# Patient Record
Sex: Female | Born: 1970 | Race: Black or African American | Hispanic: No | Marital: Single | Smoking: Never smoker
Health system: Southern US, Community
[De-identification: ages and names within clinical notes are randomized; demographics above are authoritative.]

## PROBLEM LIST (undated history)

## (undated) DIAGNOSIS — T7840XA Allergy, unspecified, initial encounter: Secondary | ICD-10-CM

## (undated) HISTORY — DX: Allergy, unspecified, initial encounter: T78.40XA

## (undated) HISTORY — PX: UTERINE FIBROID EMBOLIZATION: SHX825

---

## 2016-04-18 ENCOUNTER — Encounter (HOSPITAL_COMMUNITY): Payer: Self-pay

## 2016-04-18 ENCOUNTER — Emergency Department (HOSPITAL_COMMUNITY): Payer: No Typology Code available for payment source

## 2016-04-18 ENCOUNTER — Emergency Department (HOSPITAL_COMMUNITY)
Admission: EM | Admit: 2016-04-18 | Discharge: 2016-04-18 | Disposition: A | Discharge status: Home / Self Care | Payer: No Typology Code available for payment source | Attending: Emergency Medicine | Admitting: Emergency Medicine

## 2016-04-18 DIAGNOSIS — Y939 Activity, unspecified: Secondary | ICD-10-CM | POA: Insufficient documentation

## 2016-04-18 DIAGNOSIS — Y92410 Unspecified street and highway as the place of occurrence of the external cause: Secondary | ICD-10-CM | POA: Diagnosis not present

## 2016-04-18 DIAGNOSIS — Y999 Unspecified external cause status: Secondary | ICD-10-CM | POA: Insufficient documentation

## 2016-04-18 DIAGNOSIS — M542 Cervicalgia: Secondary | ICD-10-CM

## 2016-04-18 DIAGNOSIS — M545 Low back pain: Secondary | ICD-10-CM

## 2016-04-18 DIAGNOSIS — S3992XA Unspecified injury of lower back, initial encounter: Secondary | ICD-10-CM | POA: Insufficient documentation

## 2016-04-18 DIAGNOSIS — S199XXA Unspecified injury of neck, initial encounter: Secondary | ICD-10-CM | POA: Diagnosis not present

## 2016-04-18 MED ORDER — ACETAMINOPHEN 325 MG PO TABS
650.0000 mg | ORAL_TABLET | Freq: Once | ORAL | Status: AC
  Administered 2016-04-18: 650 mg via ORAL
  Filled 2016-04-18: qty 2
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
## 2016-04-18 NOTE — ED Notes (Signed)
PT placed in c-collar.
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
## 2016-06-29 ENCOUNTER — Other Ambulatory Visit: Payer: Self-pay

## 2016-06-29 DIAGNOSIS — D241 Benign neoplasm of right breast: Secondary | ICD-10-CM

## 2016-09-05 ENCOUNTER — Other Ambulatory Visit: Payer: Self-pay

## 2016-09-05 DIAGNOSIS — D241 Benign neoplasm of right breast: Secondary | ICD-10-CM

## 2016-09-13 ENCOUNTER — Other Ambulatory Visit: Payer: Self-pay

## 2016-09-22 ENCOUNTER — Ambulatory Visit
Admission: RE | Admit: 2016-09-22 | Discharge: 2016-09-22 | Disposition: A | Discharge status: Home / Self Care | Payer: Self-pay | Source: Ambulatory Visit

## 2016-09-22 DIAGNOSIS — D241 Benign neoplasm of right breast: Secondary | ICD-10-CM

## 2017-05-02 ENCOUNTER — Other Ambulatory Visit: Payer: Self-pay

## 2017-05-02 ENCOUNTER — Ambulatory Visit
Admission: RE | Admit: 2017-05-02 | Discharge: 2017-05-02 | Disposition: A | Discharge status: Home / Self Care | Payer: Self-pay | Source: Ambulatory Visit | Attending: Family Medicine | Admitting: Family Medicine

## 2017-05-02 ENCOUNTER — Other Ambulatory Visit: Payer: Self-pay | Admitting: Family Medicine

## 2017-05-02 DIAGNOSIS — M79671 Pain in right foot: Secondary | ICD-10-CM

## 2017-05-02 DIAGNOSIS — D241 Benign neoplasm of right breast: Secondary | ICD-10-CM

## 2017-05-08 ENCOUNTER — Other Ambulatory Visit: Payer: BC Managed Care – PPO

## 2017-05-17 ENCOUNTER — Other Ambulatory Visit: Payer: Self-pay

## 2017-05-17 ENCOUNTER — Ambulatory Visit
Admission: RE | Admit: 2017-05-17 | Discharge: 2017-05-17 | Disposition: A | Discharge status: Home / Self Care | Payer: BC Managed Care – PPO | Source: Ambulatory Visit

## 2017-05-17 DIAGNOSIS — D241 Benign neoplasm of right breast: Secondary | ICD-10-CM

## 2017-06-16 ENCOUNTER — Other Ambulatory Visit: Payer: Self-pay | Admitting: Obstetrics and Gynecology

## 2017-06-16 DIAGNOSIS — R102 Pelvic and perineal pain: Secondary | ICD-10-CM

## 2017-06-16 DIAGNOSIS — N920 Excessive and frequent menstruation with regular cycle: Secondary | ICD-10-CM

## 2017-06-25 ENCOUNTER — Other Ambulatory Visit: Payer: BC Managed Care – PPO

## 2017-07-05 ENCOUNTER — Ambulatory Visit: Payer: Self-pay | Admitting: Family Medicine

## 2017-07-24 ENCOUNTER — Other Ambulatory Visit: Payer: Self-pay | Admitting: Obstetrics and Gynecology

## 2017-07-24 DIAGNOSIS — D259 Leiomyoma of uterus, unspecified: Secondary | ICD-10-CM

## 2017-07-24 DIAGNOSIS — N920 Excessive and frequent menstruation with regular cycle: Secondary | ICD-10-CM

## 2017-07-24 DIAGNOSIS — R102 Pelvic and perineal pain: Secondary | ICD-10-CM

## 2017-07-26 ENCOUNTER — Ambulatory Visit
Admission: RE | Admit: 2017-07-26 | Discharge: 2017-07-26 | Disposition: A | Discharge status: Home / Self Care | Payer: BC Managed Care – PPO | Source: Ambulatory Visit | Attending: Obstetrics and Gynecology | Admitting: Obstetrics and Gynecology

## 2017-07-26 DIAGNOSIS — R102 Pelvic and perineal pain: Secondary | ICD-10-CM

## 2017-07-26 DIAGNOSIS — N920 Excessive and frequent menstruation with regular cycle: Secondary | ICD-10-CM

## 2017-07-26 DIAGNOSIS — D259 Leiomyoma of uterus, unspecified: Secondary | ICD-10-CM

## 2017-07-26 MED ORDER — GADOBENATE DIMEGLUMINE 529 MG/ML IV SOLN
20.0000 mL | Freq: Once | INTRAVENOUS | Status: AC | PRN
  Administered 2017-07-26: 20 mL via INTRAVENOUS

## 2017-10-27 ENCOUNTER — Other Ambulatory Visit: Payer: Self-pay

## 2017-10-27 ENCOUNTER — Ambulatory Visit
Admission: RE | Admit: 2017-10-27 | Discharge: 2017-10-27 | Disposition: A | Discharge status: Home / Self Care | Payer: BC Managed Care – PPO | Source: Ambulatory Visit

## 2017-10-27 DIAGNOSIS — N6310 Unspecified lump in the right breast, unspecified quadrant: Secondary | ICD-10-CM

## 2017-10-27 DIAGNOSIS — D241 Benign neoplasm of right breast: Secondary | ICD-10-CM

## 2018-05-16 ENCOUNTER — Ambulatory Visit
Admission: RE | Admit: 2018-05-16 | Discharge: 2018-05-16 | Disposition: A | Discharge status: Home / Self Care | Payer: BC Managed Care – PPO | Source: Ambulatory Visit

## 2018-05-16 DIAGNOSIS — N6310 Unspecified lump in the right breast, unspecified quadrant: Secondary | ICD-10-CM

## 2018-06-28 ENCOUNTER — Other Ambulatory Visit: Payer: Self-pay | Admitting: Obstetrics and Gynecology

## 2018-07-03 ENCOUNTER — Ambulatory Visit
Admission: RE | Admit: 2018-07-03 | Discharge: 2018-07-03 | Disposition: A | Discharge status: Home / Self Care | Payer: BC Managed Care – PPO | Source: Ambulatory Visit | Attending: Family Medicine | Admitting: Family Medicine

## 2018-07-03 ENCOUNTER — Other Ambulatory Visit: Payer: Self-pay | Admitting: Family Medicine

## 2018-07-03 DIAGNOSIS — R0602 Shortness of breath: Secondary | ICD-10-CM

## 2018-12-14 DIAGNOSIS — D259 Leiomyoma of uterus, unspecified: Secondary | ICD-10-CM | POA: Insufficient documentation

## 2019-05-19 ENCOUNTER — Other Ambulatory Visit: Payer: Self-pay

## 2019-05-19 ENCOUNTER — Emergency Department (HOSPITAL_COMMUNITY)
Admission: EM | Admit: 2019-05-19 | Discharge: 2019-05-20 | Disposition: A | Discharge status: Home / Self Care | Payer: BC Managed Care – PPO | Attending: Emergency Medicine | Admitting: Emergency Medicine

## 2019-05-19 ENCOUNTER — Encounter (HOSPITAL_COMMUNITY): Payer: Self-pay | Admitting: Emergency Medicine

## 2019-05-19 ENCOUNTER — Emergency Department (HOSPITAL_COMMUNITY): Payer: BC Managed Care – PPO

## 2019-05-19 DIAGNOSIS — Y9301 Activity, walking, marching and hiking: Secondary | ICD-10-CM | POA: Diagnosis not present

## 2019-05-19 DIAGNOSIS — Y92481 Parking lot as the place of occurrence of the external cause: Secondary | ICD-10-CM | POA: Insufficient documentation

## 2019-05-19 DIAGNOSIS — Y999 Unspecified external cause status: Secondary | ICD-10-CM | POA: Insufficient documentation

## 2019-05-19 DIAGNOSIS — M79662 Pain in left lower leg: Secondary | ICD-10-CM | POA: Diagnosis not present

## 2019-05-19 DIAGNOSIS — M25562 Pain in left knee: Secondary | ICD-10-CM | POA: Diagnosis not present

## 2019-05-19 DIAGNOSIS — R0789 Other chest pain: Secondary | ICD-10-CM | POA: Diagnosis present

## 2019-05-19 DIAGNOSIS — M542 Cervicalgia: Secondary | ICD-10-CM | POA: Insufficient documentation

## 2019-05-19 DIAGNOSIS — M25571 Pain in right ankle and joints of right foot: Secondary | ICD-10-CM | POA: Insufficient documentation

## 2019-05-19 DIAGNOSIS — T07XXXA Unspecified multiple injuries, initial encounter: Secondary | ICD-10-CM

## 2019-05-19 DIAGNOSIS — M79661 Pain in right lower leg: Secondary | ICD-10-CM | POA: Insufficient documentation

## 2019-05-19 DIAGNOSIS — M25561 Pain in right knee: Secondary | ICD-10-CM | POA: Insufficient documentation

## 2019-05-19 DIAGNOSIS — M25552 Pain in left hip: Secondary | ICD-10-CM | POA: Insufficient documentation

## 2019-05-19 DIAGNOSIS — M549 Dorsalgia, unspecified: Secondary | ICD-10-CM | POA: Insufficient documentation
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
## 2019-05-20 ENCOUNTER — Emergency Department (HOSPITAL_COMMUNITY): Payer: BC Managed Care – PPO

## 2019-05-20 DIAGNOSIS — R0789 Other chest pain: Secondary | ICD-10-CM | POA: Diagnosis not present

## 2019-05-20 LAB — PREGNANCY, URINE: Preg Test, Ur: NEGATIVE

## 2019-05-20 MED ORDER — HYDROCODONE-ACETAMINOPHEN 5-325 MG PO TABS
1.0000 | ORAL_TABLET | Freq: Once | ORAL | Status: AC
  Administered 2019-05-20: 1 via ORAL
  Filled 2019-05-20 (×2): qty 1

## 2019-05-20 MED ORDER — ACETAMINOPHEN 500 MG PO TABS: 1000.0000 mg | ORAL_TABLET | Freq: Three times a day (TID) | ORAL | 0 refills | Status: AC
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
## 2019-05-20 NOTE — Discharge Instructions (Signed)
You may use over-the-counter Motrin (Ibuprofen), Acetaminophen (Tylenol), topical muscle creams such as SalonPas, [employer], [place], etc. You may use ice packs or take cold showers/baths for the next 24-48 hours. Afterward, please stretch, apply heat, and have massage therapy for additional assistance.

## 2019-12-10 ENCOUNTER — Emergency Department (HOSPITAL_COMMUNITY): Payer: BC Managed Care – PPO

## 2019-12-10 ENCOUNTER — Encounter (HOSPITAL_COMMUNITY): Payer: Self-pay | Admitting: Emergency Medicine

## 2019-12-10 ENCOUNTER — Other Ambulatory Visit: Payer: Self-pay

## 2019-12-10 ENCOUNTER — Emergency Department (HOSPITAL_COMMUNITY)
Admission: EM | Admit: 2019-12-10 | Discharge: 2019-12-10 | Disposition: A | Discharge status: Home / Self Care | Payer: BC Managed Care – PPO | Attending: Emergency Medicine | Admitting: Emergency Medicine

## 2019-12-10 DIAGNOSIS — R091 Pleurisy: Secondary | ICD-10-CM

## 2019-12-10 DIAGNOSIS — R0789 Other chest pain: Secondary | ICD-10-CM | POA: Diagnosis present

## 2019-12-10 LAB — TROPONIN I (HIGH SENSITIVITY)
Troponin I (High Sensitivity): <2 ng/L (ref <18)
Troponin I (High Sensitivity): <2 ng/L (ref <18)

## 2019-12-10 LAB — URINALYSIS, ROUTINE W REFLEX MICROSCOPIC
Bilirubin Urine: NEGATIVE
Glucose, UA: NEGATIVE mg/dL
Ketones, ur: 5 mg/dL — AB
Leukocytes,Ua: NEGATIVE
Nitrite: NEGATIVE
Protein, ur: NEGATIVE mg/dL
Specific Gravity, Urine: 1.005 (ref 1.005–1.030)
pH: 6 (ref 5.0–8.0)

## 2019-12-10 LAB — CBC WITH DIFFERENTIAL/PLATELET
Abs Immature Granulocytes: 0.03 10*3/uL (ref 0.00–0.07)
Basophils Absolute: 0.1 10*3/uL (ref 0.0–0.1)
Basophils Relative: 1 %
Eosinophils Absolute: 0.2 10*3/uL (ref 0.0–0.5)
Eosinophils Relative: 3 %
HCT: 42.1 % (ref 36.0–46.0)
Hemoglobin: 13.1 g/dL (ref 12.0–15.0)
Immature Granulocytes: 0 %
Lymphocytes Relative: 33 %
Lymphs Abs: 2.6 10*3/uL (ref 0.7–4.0)
MCH: 29.2 pg (ref 26.0–34.0)
MCHC: 31.1 g/dL (ref 30.0–36.0)
MCV: 93.8 fL (ref 80.0–100.0)
Monocytes Absolute: 0.6 10*3/uL (ref 0.1–1.0)
Monocytes Relative: 8 %
Neutro Abs: 4.4 10*3/uL (ref 1.7–7.7)
Neutrophils Relative %: 55 %
Platelets: 256 10*3/uL (ref 150–400)
RBC: 4.49 MIL/uL (ref 3.87–5.11)
RDW: 14 % (ref 11.5–15.5)
WBC: 7.9 10*3/uL (ref 4.0–10.5)
nRBC: 0 % (ref 0.0–0.2)

## 2019-12-10 LAB — BASIC METABOLIC PANEL
Anion gap: 9 (ref 5–15)
BUN: 6 mg/dL (ref 6–20)
CO2: 23 mmol/L (ref 22–32)
Calcium: 9.1 mg/dL (ref 8.9–10.3)
Chloride: 107 mmol/L (ref 98–111)
Creatinine, Ser: 1 mg/dL (ref 0.44–1.00)
GFR calc Af Amer: >60 mL/min (ref >60)
GFR calc non Af Amer: >60 mL/min (ref >60)
Glucose, Bld: 76 mg/dL (ref 70–99)
Potassium: 4 mmol/L (ref 3.5–5.1)
Sodium: 139 mmol/L (ref 135–145)

## 2019-12-10 LAB — HEPATIC FUNCTION PANEL
ALT: 14 U/L (ref 0–44)
AST: 17 U/L (ref 15–41)
Albumin: 3.6 g/dL (ref 3.5–5.0)
Alkaline Phosphatase: 98 U/L (ref 38–126)
Bilirubin, Direct: 0.2 mg/dL (ref 0.0–0.2)
Indirect Bilirubin: 0.4 mg/dL (ref 0.3–0.9)
Total Bilirubin: 0.6 mg/dL (ref 0.3–1.2)
Total Protein: 6.8 g/dL (ref 6.5–8.1)

## 2019-12-10 LAB — LIPASE, BLOOD: Lipase: 22 U/L (ref 11–51)

## 2019-12-10 LAB — D-DIMER, QUANTITATIVE: D-Dimer, Quant: 0.83 ug/mL-FEU — ABNORMAL HIGH (ref 0.00–0.50)

## 2019-12-10 MED ORDER — KETOROLAC TROMETHAMINE 15 MG/ML IJ SOLN
15.0000 mg | Freq: Once | INTRAMUSCULAR | Status: AC
  Administered 2019-12-10: 15 mg via INTRAVENOUS
  Filled 2019-12-10: qty 1

## 2019-12-10 MED ORDER — IOHEXOL 350 MG/ML SOLN
80.0000 mL | Freq: Once | INTRAVENOUS | Status: AC | PRN
  Administered 2019-12-10: 80 mL via INTRAVENOUS

## 2019-12-10 MED ORDER — IBUPROFEN 600 MG PO TABS: 600.0000 mg | ORAL_TABLET | Freq: Four times a day (QID) | ORAL | 0 refills | Status: DC | PRN

## 2019-12-10 NOTE — ED Triage Notes (Signed)
Pt. Stated, I went to [facility]. And he told me to come here. Im having left pain 3 inches below breast. Pain started yesterday morning.

## 2019-12-10 NOTE — ED Provider Notes (Signed)
[facility] EMERGENCY DEPARTMENT Provider Note   CSN: [id number] Arrival date & time: 12/10/19  1019     History Chief Complaint  Patient presents with  . Chest Pain    left    Ruth Bryant is a 49 y.o. female.  49 y.o female with no PMH presents to the ED with a chief complaint of left upper quadrant pain x 2 days. She reports a  Intermittent pain colitis to the left upper quadrant which has been ongoing for the past 2 days, this is exacerbated with walking, sitting, turning.  Reports she was evaluated by urgent care, and advised to be seen in the ED. She reports taking ginger, muscle relaxers, medication for her symptoms without improvement.  She reports she thought this was likely gas pain, states on the way here driving was hurting her as she was passing speed bumps.  States she woke up this morning and felt like the pain had returned.  Reports his pain is exacerbated with deep inspiration.  Last bowel movement was this morning and it was normal in nature.No chest pain, no prior history of blood clots, no abdominal pain, no nausea or vomiting.    The history is provided by the patient.  Chest Pain Pain location:  L chest Associated symptoms: abdominal pain   Associated symptoms: no back pain, no fever, no headache, no nausea, no shortness of breath and no vomiting        No past medical history on file.  There are no problems to display for this patient.   Past Surgical History:  Procedure Laterality Date  . UTERINE FIBROID EMBOLIZATION       OB History   No obstetric history on file.     Family History  Problem Relation Age of Onset  . Breast cancer Paternal Grandmother     Social History   Tobacco Use  . Smoking status: Never Smoker  . Smokeless tobacco: Never Used  Substance Use Topics  . Alcohol use: Yes  . Drug use: Not Currently    Home Medications Prior to Admission medications   Medication Sig Start Date End Date Taking?  Authorizing Provider  Chaste Tree (VITEX EXTRACT PO) Take 1 capsule by mouth daily. Only take while on period.   Yes [provider]  cyclobenzaprine (FLEXERIL) 10 MG tablet Take 10 mg by mouth as needed for muscle spasms.   Yes [provider]  Homeopathic Products (ARNICA MONTANA PO) Take 2 tablets by mouth as needed (muscle pain).   Yes [provider]  NON FORMULARY Propheria cream  ADP - 1/4 teaspoon per day after period stops and stop the day period begins.   Yes [provider]  NONFORMULARY OR COMPOUNDED ITEM Take 1 Dose by mouth daily. Tincture, liquid taken with dropper   Yes [provider]  Nutritional Supplements (CALCIUM D-GLUCARATE PO) Take 2 capsules by mouth daily. Do not take while on period.   Yes [provider]  OVER THE COUNTER MEDICATION Take 1 capsule by mouth 2 (two) times daily. Fibrovera Advanced Hormonal Support; only take while on period.   Yes [provider]  OVER THE COUNTER MEDICATION Take 400 mg by mouth daily. Seeking Health DIM + I3C 400mg  capsules; only take when period ends   Yes [provider]    Allergies    Shellfish allergy and Other  Review of Systems   Review of Systems  Constitutional: Negative for chills and fever.  HENT:  Negative for sore throat.   Respiratory: Negative for shortness of breath and wheezing.   Cardiovascular: Positive for chest pain. Negative for leg swelling.  Gastrointestinal: Positive for abdominal pain. Negative for nausea and vomiting.  Genitourinary: Negative for flank pain.  Musculoskeletal: Negative for back pain.  Skin: Negative for pallor and wound.  Neurological: Negative for light-headedness and headaches.  All other systems reviewed and are negative.   Physical Exam Updated Vital Signs BP 139/62 (BP Location: Right Arm)   Pulse 66   Temp 98.2 F (36.8 C) (Oral)   Resp (!) 24   Ht 5\' 4"  (1.626 m)   Wt 118.4 kg   LMP 11/26/2019   SpO2  100%   BMI 44.80 kg/m   Physical Exam Vitals and nursing note reviewed.  Constitutional:      Appearance: She is well-developed.  HENT:     Head: Normocephalic and atraumatic.  Eyes:     Pupils: Pupils are equal, round, and reactive to light.  Cardiovascular:     Rate and Rhythm: Normal rate.     Heart sounds: Normal heart sounds.  Pulmonary:     Effort: Pulmonary effort is normal. No tachypnea.     Breath sounds: Decreased breath sounds present.  Chest:     Chest wall: No tenderness.  Abdominal:     Palpations: Abdomen is soft.     Tenderness: There is abdominal tenderness in the left upper quadrant.       Comments: Tenderness to palpation along the left upper quadrant.  Pain with palpation of the left ribs.   Musculoskeletal:     Right lower leg: No tenderness. No edema.     Left lower leg: No tenderness. No edema.  Skin:    General: Skin is warm and dry.  Neurological:     Mental Status: She is alert and oriented to person, place, and time.     ED Results / Procedures / Treatments   Labs (all labs ordered are listed, but only abnormal results are displayed) Labs Reviewed  D-DIMER, QUANTITATIVE (NOT AT [facility]) - Abnormal; Notable for the following components:      Result Value   D-Dimer, Quant 0.83 (*)    All other components within normal limits  URINALYSIS, ROUTINE W REFLEX MICROSCOPIC - Abnormal; Notable for the following components:   Color, Urine STRAW (*)    Hgb urine dipstick LARGE (*)    Ketones, ur 5 (*)    Bacteria, UA RARE (*)    All other components within normal limits  CBC WITH DIFFERENTIAL/PLATELET  BASIC METABOLIC PANEL  HEPATIC FUNCTION PANEL  LIPASE, BLOOD  TROPONIN I (HIGH SENSITIVITY)  TROPONIN I (HIGH SENSITIVITY)    EKG EKG Interpretation  Date/Time:  Tuesday December 10 2019 16:03:18 EDT Ventricular Rate:  64 PR Interval:    QRS Duration: 85 QT Interval:  388 QTC Calculation: 401 R Axis:   74 Text Interpretation: Sinus rhythm  Borderline T wave abnormalities no prior available for comparison Confirmed by Quintella Reichert [phone]) on 12/10/2019 5:59:47 PM   Radiology DG Chest 2 View  Result Date: 12/10/2019 CLINICAL DATA:  Chest pain EXAM: CHEST - 2 VIEW COMPARISON:  Radiographs 07/03/2018 FINDINGS: Low lung volumes with some streaky atelectatic changes in the bases. Few septal lines are present as well as some central vascular congestion which could indicate mild edema with a small left pleural effusion. Cardiomediastinal contours are similar to priors accounting for low volumes. No acute osseous or soft tissue abnormality.  Telemetry leads overlie the chest. IMPRESSION: 1. Low lung volumes with some streaky atelectatic changes in the bases. 2. Features suggesting mild interstitial edema with small left pleural effusion. Electronically Signed   By: Lovena Le M.D.   On: 12/10/2019 16:40    Procedures Procedures (including critical care time)  Medications Ordered in ED Medications - No data to display  ED Course  I have reviewed the triage vital signs and the nursing notes.  Pertinent labs & imaging results that were available during my care of the patient were reviewed by me and considered in my medical decision making (see chart for details).  Clinical Course as of Dec 10 1922  Tue Dec 10, 2019  1713 Varies between 24-30 during evaluation  Resp(!): 24 [JS]  1729 Troponin I (High Sensitivity): <2 [JS]  1823 D-Dimer, Quant(!): 0.83 [JS]    Clinical Course User Index [JS] Janeece Fitting, PA-C   MDM Rules/Calculators/A&P   Patient with no pertinent past medical history presents to the ED with complaints of left upper quadrant pain for the past 2 days.  Evaluated by urgent care, referred to the ED for further evaluation.  Reports his pain is exacerbated with movement, she thought that this was likely due to gas, took some gas relief, reports some improvement with that but later pain returned this  morning.  During evaluation patient is overall well-appearing, nontoxic.  Does appear to be tachypneic on my exam ranging with respirations from 20s to low 30s.  Reports the pain is exacerbated with deep inspiration, grabbing at the left side of her abdomen.  There is significant tenderness to palpation along the left upper quadrant.  No visible bruising, hematoma noted.  She denies any trauma.  Rest of the abdomen is soft without any tenderness to palpation.  No CVA noted bilaterally.  I have obtained labs and these were reviewed by me, CBC without any leukocytosis, no signs of anemia.  BMP without any electrolyte derangement, creatinine level is within normal limits, she does not have any CVA tenderness, no urinary symptoms on today's visit, no fevers have a low suspicion for nephrolithiasis.  Chest x-ray was obtained along with troponin, her first troponin was less than 2, pain seems to be more so along the left upper quadrant and not so the chest.  She does endorse shortness of breath with ambulation, there is no tachycardia or hypoxia during her evaluation, however some suspicion of pulmonary embolism as pain is pleuritic and worse with inspiration.Lower suspicion on pancreatic involvement as she denies any hx of alcohol use or prior hx of gallstones.   DG Chest showed:  1. Low lung volumes with some streaky atelectatic changes in the  bases.  2. Features suggesting mild interstitial edema with small left  pleural effusion.   We will obtain hepatic function along with a D-dimer as patient is low risk.  Does not have any prior history of smoking, no cardiac history, no prior history of blood clots or clotting disorder.  6:23 PM patient D-dimer is slightly elevated 0.83, will obtain CT angio chest to further rule out pulmonary embolism.  I have also added hepatic function along with lipase level although I feel that less likely to be of abdominal etiology.  Labs show large hemoglobin, rare  bacteria, lipase level is within normal limits.  Hepatic functions are within normal limits.  Patient is currently pending CT angio rule out pulmonary embolism.  Patient care signed out to Dr. Ralene Bathe pending CTA.  Portions of this note were generated with [employer]. Dictation errors may occur despite best attempts at proofreading.  Final Clinical Impression(s) / ED Diagnoses Final diagnoses:  Atypical chest pain    Rx / DC Orders ED Discharge Orders    None       Janeece Fitting, Hershal Coria 12/10/19 1924    Quintella Reichert, MD 12/10/19 2244

## 2020-04-17 ENCOUNTER — Other Ambulatory Visit: Payer: Self-pay | Admitting: Orthopaedic Surgery

## 2020-04-17 DIAGNOSIS — S83249A Other tear of medial meniscus, current injury, unspecified knee, initial encounter: Secondary | ICD-10-CM

## 2020-04-23 ENCOUNTER — Other Ambulatory Visit: Payer: BC Managed Care – PPO

## 2020-05-07 ENCOUNTER — Other Ambulatory Visit: Payer: BC Managed Care – PPO

## 2020-06-02 ENCOUNTER — Other Ambulatory Visit: Payer: Self-pay

## 2020-06-02 ENCOUNTER — Ambulatory Visit (HOSPITAL_COMMUNITY)
Admission: RE | Admit: 2020-06-02 | Discharge: 2020-06-02 | Disposition: A | Discharge status: Home / Self Care | Payer: BC Managed Care – PPO | Source: Ambulatory Visit | Attending: Vascular Surgery | Admitting: Vascular Surgery

## 2020-06-02 ENCOUNTER — Other Ambulatory Visit (HOSPITAL_COMMUNITY): Payer: Self-pay | Admitting: Orthopaedic Surgery

## 2020-06-02 DIAGNOSIS — M7989 Other specified soft tissue disorders: Secondary | ICD-10-CM

## 2020-06-02 DIAGNOSIS — M79604 Pain in right leg: Secondary | ICD-10-CM

## 2020-06-05 ENCOUNTER — Other Ambulatory Visit: Payer: Self-pay

## 2020-06-05 ENCOUNTER — Ambulatory Visit: Payer: BC Managed Care – PPO | Admitting: Physician Assistant

## 2020-06-05 ENCOUNTER — Encounter: Payer: Self-pay | Admitting: Physician Assistant

## 2020-06-05 VITALS — BP 143/94 | HR 88 | Temp 98.2°F | Resp 20 | Ht 64.0 in | Wt 261.0 lb

## 2020-06-05 DIAGNOSIS — S83249A Other tear of medial meniscus, current injury, unspecified knee, initial encounter: Secondary | ICD-10-CM | POA: Diagnosis not present

## 2020-06-05 DIAGNOSIS — I82401 Acute embolism and thrombosis of unspecified deep veins of right lower extremity: Secondary | ICD-10-CM | POA: Diagnosis not present

## 2020-06-05 DIAGNOSIS — M7989 Other specified soft tissue disorders: Secondary | ICD-10-CM

## 2020-06-05 NOTE — Progress Notes (Signed)
[facility]   Reason for referral: Swollen Right leg  History of Present Illness  Ruth Bryant is a 49 y.o. female who presents with chief complaint: swollen leg.  Patient notes, onset of swelling 1 week ago.  She also has calf pain and has difficulty bearing weight after sitting too long.  After a DVT study on 06/02/2020 which revealed right LE DVT from mid femoral vain to PTV she was started on Xarelto by [facility].   The patient has had no history of DVT, no history of varicose vein, no history of venous stasis ulcers, no history of  Lymphedema and no history of skin changes in lower legs.  There is no family history of venous disorders.  The patient has not used compression stockings in the past.  She denise loss of motor and sensation.  She denise SOB and CP.    Past Medical History:  Diagnosis Date  . Allergy     Past Surgical History:  Procedure Laterality Date  . UTERINE FIBROID EMBOLIZATION      Social History   Socioeconomic History  . Marital status: Single    Spouse name: Not on file  . Number of children: 0  . Years of education: Not on file  . Highest education level: Not on file  Occupational History  . Not on file  Tobacco Use  . Smoking status: Never Smoker  . Smokeless tobacco: Never Used  Substance and Sexual Activity  . Alcohol use: Yes  . Drug use: Not Currently  . Sexual activity: Not on file  Other Topics Concern  . Not on file  Social History Narrative  . Not on file   Social Determinants of Health   Financial Resource Strain: Not on file  Food Insecurity: Not on file  Transportation Needs: Not on file  Physical Activity: Not on file  Stress: Not on file  Social Connections: Not on file  Intimate Partner Violence: Not on file    Family History  Problem Relation Age of Onset  . Breast cancer Paternal Grandmother   . Hypertension Mother   . Deep vein thrombosis Mother   . Hypertension Father      Current Outpatient Medications on File Prior to Visit  Medication Sig Dispense Refill  . celecoxib (CELEBREX) 100 MG capsule Take 100 mg by mouth 2 (two) times daily.    . Chaste Tree (VITEX EXTRACT PO) Take 1 capsule by mouth daily. Only take while on period.    . cyclobenzaprine (FLEXERIL) 5 MG tablet Take 5 mg by mouth at bedtime as needed.    . Homeopathic Products (ARNICA MONTANA PO) Take 2 tablets by mouth as needed (muscle pain).    Marland Kitchen ibuprofen (ADVIL) 600 MG tablet Take 1 tablet (600 mg total) by mouth every 6 (six) hours as needed. 20 tablet 0  . NON FORMULARY Propheria cream  ADP - 1/4 teaspoon per day after period stops and stop the day period begins.    . NONFORMULARY OR COMPOUNDED ITEM Take 1 Dose by mouth daily. Tincture, liquid taken with dropper    . Nutritional Supplements (CALCIUM D-GLUCARATE PO) Take 2 capsules by mouth daily. Do not take while on period.    Marland Kitchen OVER THE COUNTER MEDICATION Take 1 capsule by mouth 2 (two) times daily. Fibrovera Advanced Hormonal Support; only take while on period.    Marland Kitchen OVER THE COUNTER MEDICATION Take 400 mg by mouth daily. Seeking Health DIM + I3C  400mg  capsules; only take when period ends     No current facility-administered medications on file prior to visit.    Allergies as of 06/05/2020 - Review Complete 06/05/2020  Allergen Reaction Noted  . Dairycare [lactase-lactobacillus]  06/05/2020  . Shellfish allergy Shortness Of Breath and Swelling 12/10/2019  . Other Hives, Itching, and Swelling 12/10/2019     ROS:   General:  No weight loss, Fever, chills  HEENT: No recent headaches, no nasal bleeding, no visual changes, no sore throat  Neurologic: No dizziness, blackouts, seizures. No recent symptoms of stroke or mini- stroke. No recent episodes of slurred speech, or temporary blindness.  Cardiac: No recent episodes of chest pain/pressure, no shortness of breath at rest.  No shortness of breath with exertion.  Denies history of  atrial fibrillation or irregular heartbeat  Vascular: No history of rest pain in feet.  No history of claudication.  No history of non-healing ulcer, No history of DVT   Pulmonary: No home oxygen, no productive cough, no hemoptysis,  No asthma or wheezing  Musculoskeletal:  [ ]  Arthritis, [ ]  Low back pain,  [x ] Joint pain  Hematologic:No history of hypercoagulable state.  No history of easy bleeding.  No history of anemia  Gastrointestinal: No hematochezia or melena,  No gastroesophageal reflux, no trouble swallowing  Urinary: [ ]  chronic Kidney disease, [ ]  on HD - [ ]  MWF or [ ]  TTHS, [ ]  Burning with urination, [ ]  Frequent urination, [ ]  Difficulty urinating;   Skin: No rashes  Psychological: No history of anxiety,  No history of depression  Physical Examination  Vitals:   06/05/20 1133  BP: (!) 143/94  Pulse: 88  Resp: 20  Temp: 98.2 F (36.8 C)  TempSrc: Temporal  SpO2: 97%  Weight: 261 lb (118.4 kg)  Height: 5\' 4"  (1.626 m)    Body mass index is 44.8 kg/m.  General:  Alert and oriented, no acute distress HEENT: Normal Neck: No bruit or JVD Pulmonary: Clear to auscultation bilaterally Cardiac: Regular Rate and Rhythm without murmur Abdomen: Soft, non-tender, non-distended, no mass, no scars Skin: No rash Extremity Pulses:  2+ radial, brachial, femoral, dorsalis pedis, posterior tibial pulses bilaterally Musculoskeletal: right calf edema with tenderness to ankle dorsi flexion and plantar flexion AKA positive Homan's sign.  Neurologic: Upper and lower extremity motor 5/5 and symmetric  DATA:  12/10/2019 CTA chest  IMPRESSION: 1. No evidence of acute pulmonary embolism. 2. Borderline dilatation of the main pulmonary artery, can be seen with pulmonary arterial hypertension in the absence of pulmonary artery filling defects. 3. Small left pleural effusion with adjacent passive atelectasis. 4. Mild septal thickening towards the lung apices  with redistribution of the pulmonary vascularity as well. Findings could reflect mild pulmonary edema.   RIGHT  CompressibilityPhasicitySpontaneityPropertiesThrombus  Aging  +---------+---------------+---------+-----------+----------+--------------+   CFV   Full      No    Yes                    +---------+---------------+---------+-----------+----------+--------------+   SFJ   Full           Yes                    +---------+---------------+---------+-----------+----------+--------------+   FV Prox Full      No    Yes                    +---------+---------------+---------+-----------+----------+--------------+   FV Mid  None  No                     +---------+---------------+---------+-----------+----------+--------------+   FV DistalNone           No                     +---------+---------------+---------+-----------+----------+--------------+   POP   None           No                     +---------+---------------+---------+-----------+----------+--------------+   PTV   None           No                     +---------+---------------+---------+-----------+----------+--------------+   PERO   Full           Yes                    +---------+---------------+---------+-----------+----------+--------------+   GSV   Full      No    Yes                    +---------+---------------+---------+-----------+----------+--------------+         Findings reported to Dr. Griffin Basil at 3:40pm.    Summary:   Assessment:  RIGHT:  - Acute DVT from the PTV through the popliteal v and into the femoral vein  to mid thigh. No evidence of superficial vein  thrombosis. She has palpable pedal pulses in the right LE and left LE. Motor and sensation is intact.  Her swelling is mostly in the calf of the right leg, but compressible.  She does not have phlegmasia.     Plan:  Activity as tolerates with plans to increase them daily.  Right LE compress ion 15-20 mmgh to be worn daily and taken off at night.  Elevation of LE when at rest.   If She develops pallor, pulselessness, sever edema, SOB, CP pain she should call 911.  She will continue her Xarelto daily.  She will call her PCP to provide her with refills.  She will likely be on this for 3-6 months.  F/U in 3 months for repeat DVT study.  Roxy Horseman PA-C [facility] Office: [phone]  MD in clinic [facility] [facility]

## 2020-06-09 ENCOUNTER — Other Ambulatory Visit: Payer: Self-pay

## 2020-06-09 DIAGNOSIS — I82401 Acute embolism and thrombosis of unspecified deep veins of right lower extremity: Secondary | ICD-10-CM

## 2020-09-03 ENCOUNTER — Other Ambulatory Visit: Payer: Self-pay

## 2020-09-03 ENCOUNTER — Ambulatory Visit (HOSPITAL_COMMUNITY)
Admission: RE | Admit: 2020-09-03 | Discharge: 2020-09-03 | Disposition: A | Discharge status: Home / Self Care | Payer: BC Managed Care – PPO | Source: Ambulatory Visit | Attending: Vascular Surgery | Admitting: Vascular Surgery

## 2020-09-03 ENCOUNTER — Ambulatory Visit: Payer: BC Managed Care – PPO

## 2020-09-03 ENCOUNTER — Ambulatory Visit (INDEPENDENT_AMBULATORY_CARE_PROVIDER_SITE_OTHER): Payer: BC Managed Care – PPO | Admitting: Physician Assistant

## 2020-09-03 ENCOUNTER — Encounter (HOSPITAL_COMMUNITY): Payer: BC Managed Care – PPO

## 2020-09-03 VITALS — BP 129/75 | HR 78 | Temp 98.4°F | Resp 20 | Ht 64.0 in | Wt 263.1 lb

## 2020-09-03 DIAGNOSIS — I82401 Acute embolism and thrombosis of unspecified deep veins of right lower extremity: Secondary | ICD-10-CM | POA: Diagnosis not present

## 2020-09-03 NOTE — Progress Notes (Signed)
[facility]           OF [place]  History and Physical   Ruth Bryant is a 50 y.o. female who was seen in December 2021 for right leg swelling.  She had calf pain and difficulty bearing weight after sitting too long.  She had a DVT study that revealed a right leg DVT from mid femoral vein to posterior tibial vein and was started on Xarelto by [facility].   The patient has had no history of DVT, no history of varicose vein, no history of venous stasis ulcers, no history of  Lymphedema and no history of skin changes in lower legs.  She has been wearing her compression socks every day.  She states that her legs hurt if she does not wear them.  She states that since she started the Xarelto, she has had bouts of being hot and then being very cold.    The pt is not on a statin for cholesterol management.  The pt is not on a daily aspirin.   Other AC:  Xarelto The pt is not on medication for hypertension.   The pt is not diabetic.   Tobacco hx:  never   Past Medical History:  Diagnosis Date  . Allergy     Past Surgical History:  Procedure Laterality Date  . UTERINE FIBROID EMBOLIZATION      Social History   Socioeconomic History  . Marital status: Single    Spouse name: Not on file  . Number of children: 0  . Years of education: Not on file  . Highest education level: Not on file  Occupational History  . Not on file  Tobacco Use  . Smoking status: Never Smoker  . Smokeless tobacco: Never Used  Substance and Sexual Activity  . Alcohol use: Yes  . Drug use: Not Currently  . Sexual activity: Not on file  Other Topics Concern  . Not on file  Social History Narrative  . Not on file   Social Determinants of Health   Financial Resource Strain: Not on file  Food Insecurity: Not on file  Transportation Needs: Not on file  Physical Activity: Not on file  Stress: Not on file  Social Connections: Not on file  Intimate Partner  Violence: Not on file     Family History  Problem Relation Age of Onset  . Breast cancer Paternal Grandmother   . Hypertension Mother   . Deep vein thrombosis Mother   . Hypertension Father     Current Outpatient Medications  Medication Sig Dispense Refill  . celecoxib (CELEBREX) 100 MG capsule Take 100 mg by mouth 2 (two) times daily.    . Chaste Tree (VITEX EXTRACT PO) Take 1 capsule by mouth daily. Only take while on period.    . cyclobenzaprine (FLEXERIL) 5 MG tablet Take 5 mg by mouth at bedtime as needed.    . Homeopathic Products (ARNICA MONTANA PO) Take 2 tablets by mouth as needed (muscle pain).    Marland Kitchen ibuprofen (ADVIL) 600 MG tablet Take 1 tablet (600 mg total) by mouth every 6 (six) hours as needed. 20 tablet 0  . NON FORMULARY Propheria cream  ADP - 1/4 teaspoon per day after period stops and stop the day period begins.    . NONFORMULARY OR COMPOUNDED ITEM Take 1 Dose by mouth daily. Tincture, liquid taken with dropper    . Nutritional Supplements (CALCIUM D-GLUCARATE PO)  Take 2 capsules by mouth daily. Do not take while on period.    Marland Kitchen OVER THE COUNTER MEDICATION Take 1 capsule by mouth 2 (two) times daily. Fibrovera Advanced Hormonal Support; only take while on period.    Marland Kitchen OVER THE COUNTER MEDICATION Take 400 mg by mouth daily. Seeking Health DIM + I3C 400mg  capsules; only take when period ends     No current facility-administered medications for this visit.    Allergies  Allergen Reactions  . Dairycare [Lactase-Lactobacillus]   . Shellfish Allergy Shortness Of Breath and Swelling  . Other Hives, Itching and Swelling    Melons     REVIEW OF SYSTEMS:   [X]  denotes positive finding, [ ]  denotes negative finding Cardiac  Comments:  Chest pain or chest pressure:    Shortness of breath upon exertion:    Short of breath when lying flat:    Irregular heart rhythm:        Vascular    Pain in calf, thigh, or hip brought on by ambulation:    Pain in feet at night  that wakes you up from your sleep:     Blood clot in your veins:    Leg swelling:         Pulmonary    Oxygen at home:    Productive cough:     Wheezing:         Neurologic    Sudden weakness in arms or legs:     Sudden numbness in arms or legs:     Sudden onset of difficulty speaking or slurred speech:    Temporary loss of vision in one eye:     Problems with dizziness:         Gastrointestinal    Blood in stool:     Vomited blood:         Genitourinary    Burning when urinating:     Blood in urine:        Psychiatric    Major depression:         Hematologic    Bleeding problems:    Problems with blood clotting too easily:        Skin    Rashes or ulcers:        Constitutional    Fever or chills:      PHYSICAL EXAMINATION:  Today's Vitals   09/03/20 1403  BP: 129/75  Pulse: 78  Resp: 20  Temp: 98.4 F (36.9 C)  TempSrc: Temporal  SpO2: 98%  Weight: 263 lb 1.6 oz (119.3 kg)  Height: 5\' 4"  (1.626 m)  PainSc: 7   PainLoc: Leg   Body mass index is 45.16 kg/m.   General:  WDWN in NAD; vital signs documented above Gait: Not observed HENT: WNL, normocephalic Pulmonary: normal non-labored breathing without wheezing Cardiac: regular HR; without carotid bruits Skin: without rashes Vascular Exam/Pulses:  Right Left  Radial 2+ (normal) 2+ (normal)  DP 2+ (normal) 2+ (normal)   Extremities: without ischemic changes, without cellulitis; without open wounds Musculoskeletal: no muscle wasting or atrophy  Neurologic: A&O X 3;  moving all extremities equally Psychiatric:  The pt has Normal affect.   Non-Invasive Vascular Imaging:   Venous duplex on 09/03/2020: RIGHT:  - Findings consistent with age indeterminate deep vein thrombosis  involving the right popliteal vein.   Ruth Bryant is a 50 y.o. female who presents with: hx of right lower extremity DVT  Pt's duplex today reveals that she continues to  have DVT in the popliteal vein.  On previous  study, she had DVT in the right femoral vein down to popliteal and posterior tibial vein.   -discussed with pt to continue wearing her compression stockings  -discussed the importance of leg elevation and how to elevate properly - pt is advised to elevate their legs and a diagram is given to them to demonstrate to lay flat on their back with knees elevated and slightly bent with their feet higher than her knees, which puts their feet higher than their heart for 15 minutes per day.  If they cannot lay flat, advised to lay as flat as possible.  -pt is advised to continue as much walking as possible and avoid sitting or standing for long periods of time. 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  -discussed importance of weight loss and exercise and that water aerobics would also be beneficial.  -handout with recommendations given -pt would like to follow up again in 3 months to check the status of her DVT. -she will continue her Xarelto for now.  When she started taking this, she started having hot and cold flashes. This is not listed as a side effect for this medication.  We discussed possibly having her PCP change her over to Eliquis to see if she still has these issues.  We also discussed that she could be peri menopausal.  We also discussed she may want to get her PCP to check her thyroid function as well.    Leontine Locket, [facility] 09/03/2020 1:40 PM  Clinic MD:  Oneida Alar

## 2020-09-07 ENCOUNTER — Other Ambulatory Visit: Payer: Self-pay

## 2020-09-07 DIAGNOSIS — I82401 Acute embolism and thrombosis of unspecified deep veins of right lower extremity: Secondary | ICD-10-CM

## 2020-11-02 IMAGING — CT CT ANGIO CHEST
2 of 6 series · 16 of 46 positions shown · IV contrast (omnipaque)
Comparison: Radiograph 12/10/2019

CLINICAL DATA: Shortness of breath, left chest pain and left upper
quadrant pain

EXAM:
CT ANGIOGRAPHY CHEST WITH CONTRAST
TECHNIQUE: Multidetector CT imaging of the chest was performed using the
standard protocol during bolus administration of intravenous
contrast. Multiplanar CT image reconstructions and MIPs were
obtained to evaluate the vascular anatomy.
CONTRAST:  80mL OMNIPAQUE IOHEXOL 350 MG/ML SOLN

[Series 6: thins · axial · 0.83mm/px · z∈[+533,+727]mm · 13 of 214 slices shown]
[im 10/214  lung]
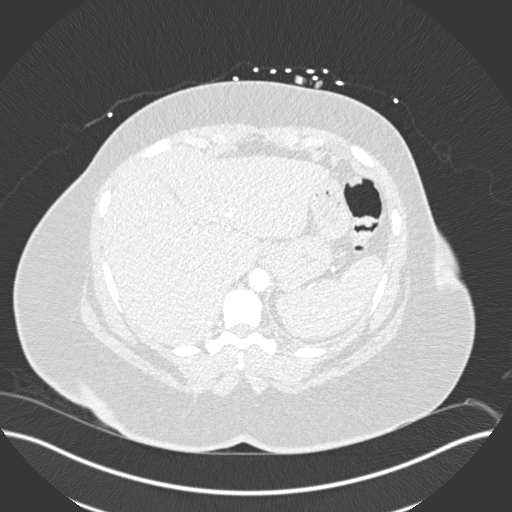
[im 28/214  soft-tissue]
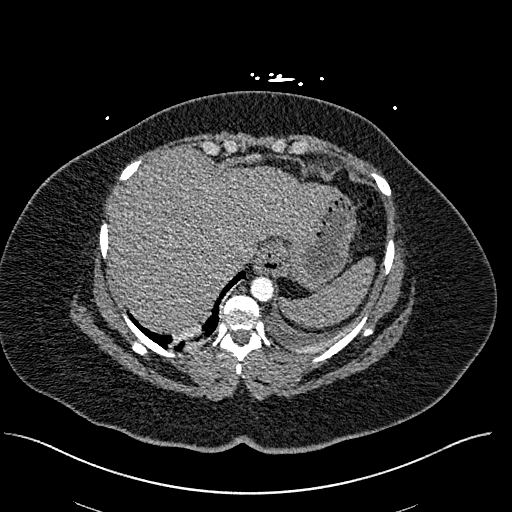
[im 47/214  lung]
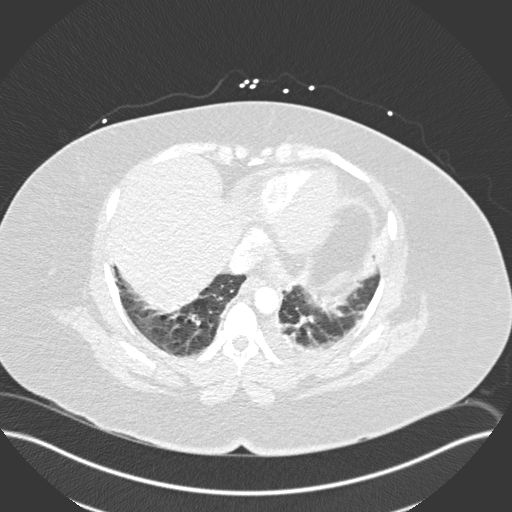
[im 56/214  soft-tissue]
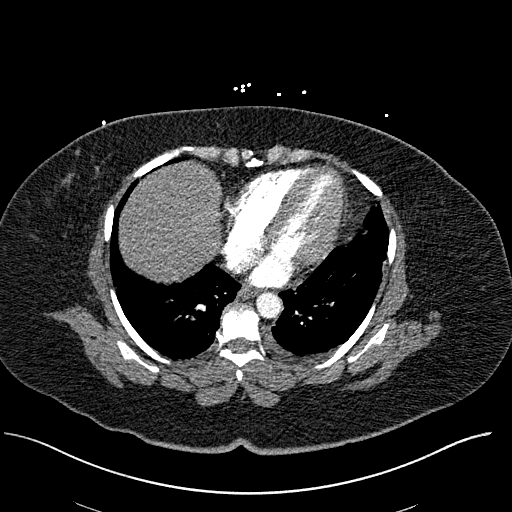
[im 75/214  lung]
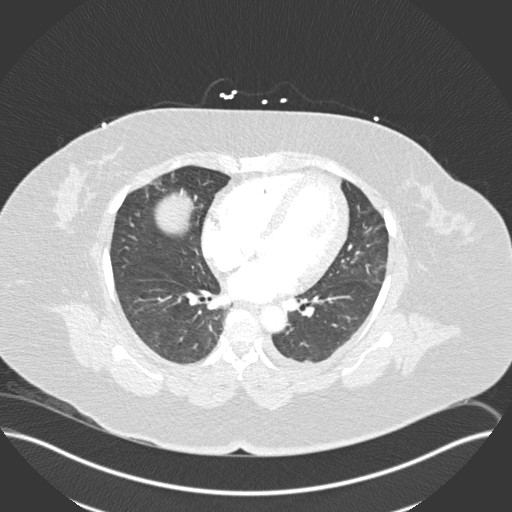
[im 93/214  soft-tissue]
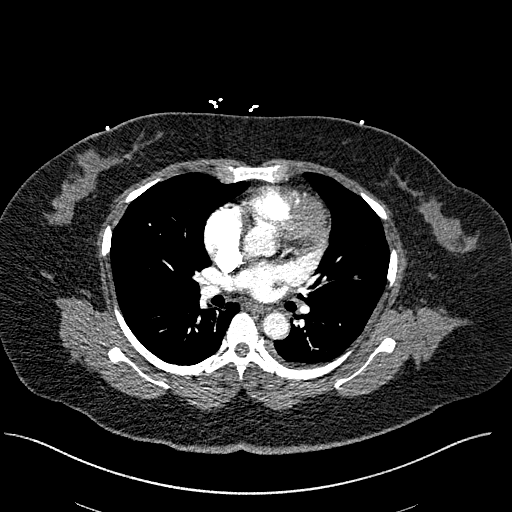
[im 112/214  lung]
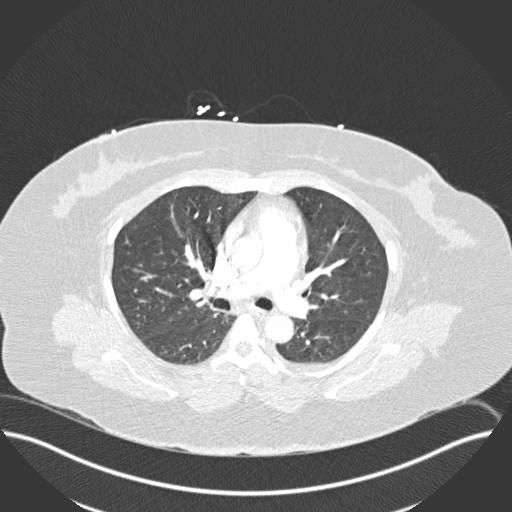
[im 121/214  soft-tissue]
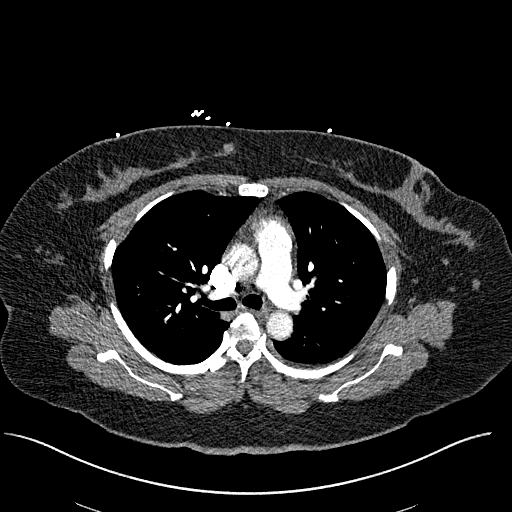
[im 139/214  lung]
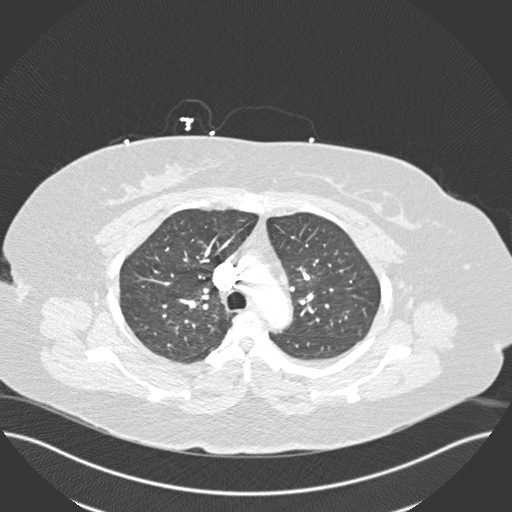
[im 158/214  soft-tissue]
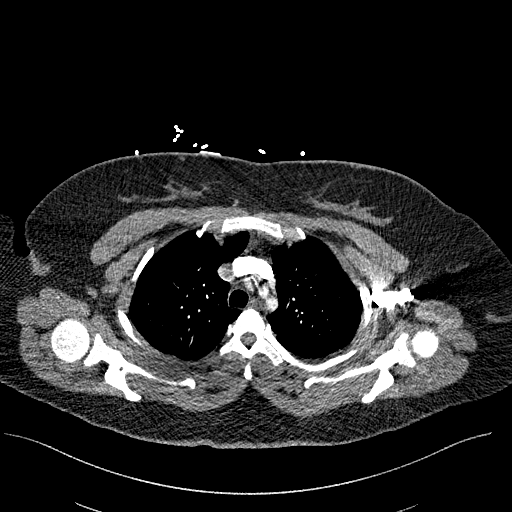
[im 167/214  lung]
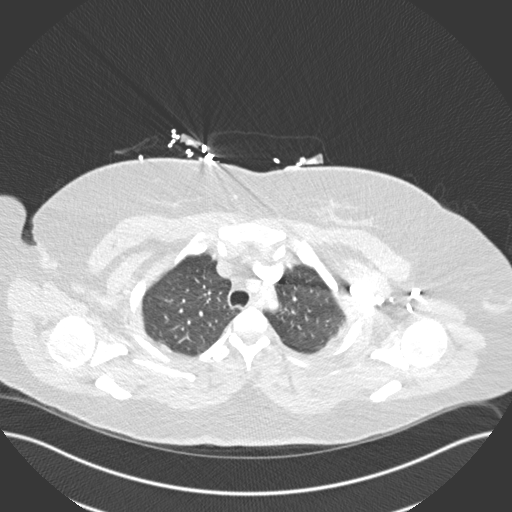
[im 186/214  soft-tissue]
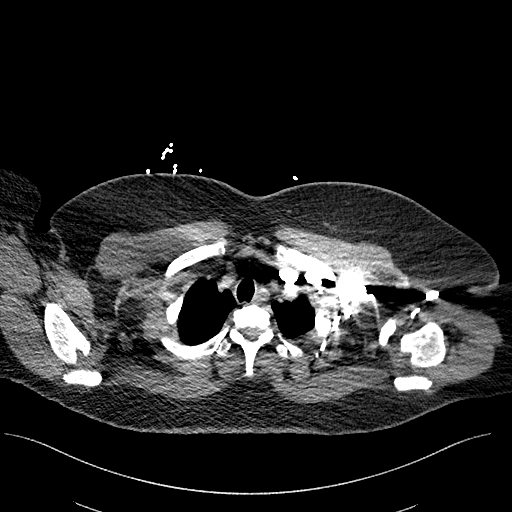
[im 204/214  lung]
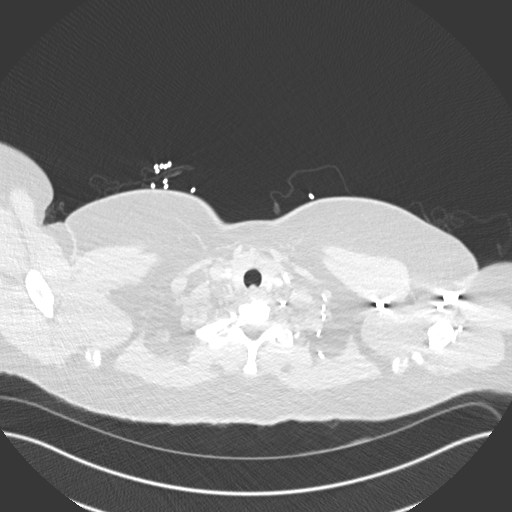

[Series 8: coronal mpr · coronal · 0.42mm/px · 3 of 151 slices shown]
[im 38/151  soft-tissue]
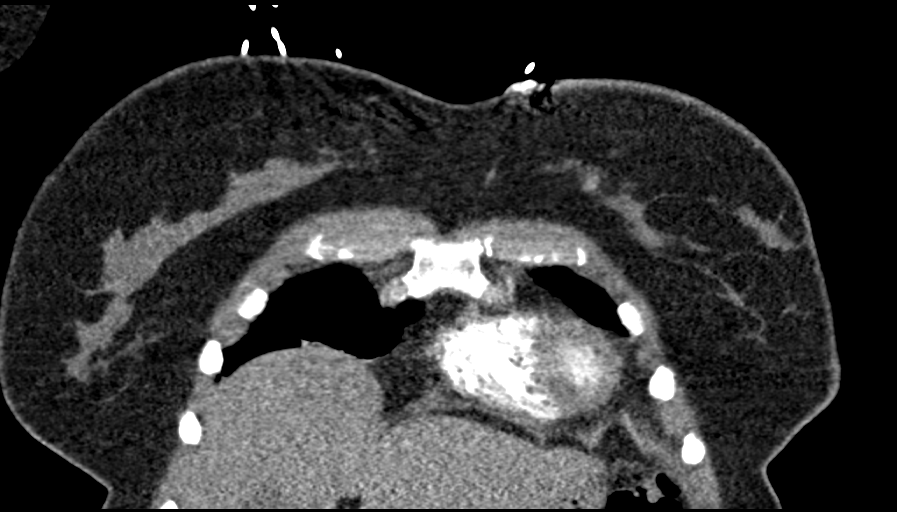
[im 76/151  soft-tissue]
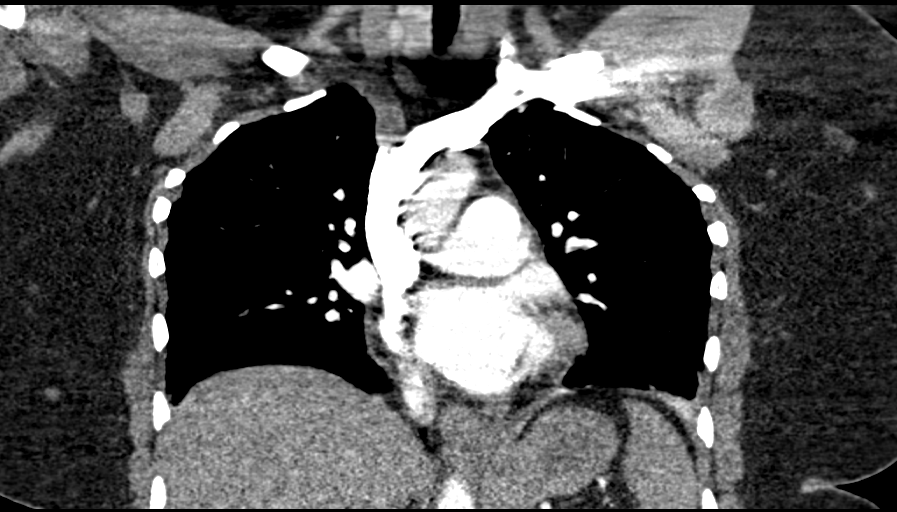
[im 113/151  soft-tissue]
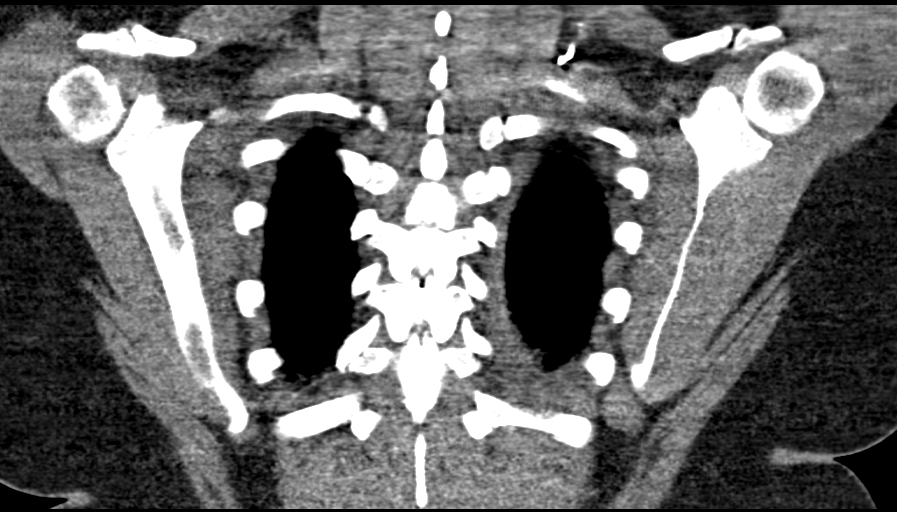

[16 of 46 positions shown; findings below may reference images not displayed]

FINDINGS: Cardiovascular: Satisfactory opacification the pulmonary arteries
without central, lobar or segmental filling defect. Dilatation of
the main pulmonary artery is noted. Mild reflux of contrast is also
noted into the IVC. Normal heart size. No pericardial effusion. No
acute luminal or periaortic abnormality is seen. Normal 3 vessel
branching of the aortic arch.

Mediastinum/Nodes: No mediastinal fluid or gas. Normal thyroid gland
and thoracic inlet. No acute abnormality of the trachea or
esophagus. No worrisome mediastinal, hilar or axillary adenopathy.

Lungs/Pleura: There are mild septal thickening is noted towards the
lung apices. Some redistribution of the pulmonary vascularity is
present as well. Small left pleural effusion with adjacent passive
atelectasis. Additional atelectatic changes present elsewhere in the
lungs including more bandlike areas of subsegmental atelectasis
and/or scarring in the right lower lobe and lingula. No concerning
pulmonary nodules or masses.

Upper Abdomen: No acute abnormalities present in the visualized
portions of the upper abdomen.

Musculoskeletal: No acute osseous abnormality or suspicious osseous
lesion.

Review of the MIP images confirms the above findings.
IMPRESSION: 1. No evidence of acute pulmonary embolism.
2. Borderline dilatation of the main pulmonary artery, can be seen
with pulmonary arterial hypertension in the absence of pulmonary
artery filling defects.
3. Small left pleural effusion with adjacent passive atelectasis.
4. Mild septal thickening towards the lung apices with
redistribution of the pulmonary vascularity as well. Findings could
reflect mild pulmonary edema.

## 2020-11-02 IMAGING — CR DG CHEST 2V
2 series · 2 of 2 positions shown · non-contrast
Comparison: Radiographs 07/03/2018

CLINICAL DATA: Chest pain

EXAM:
CHEST - 2 VIEW

[chest pa]
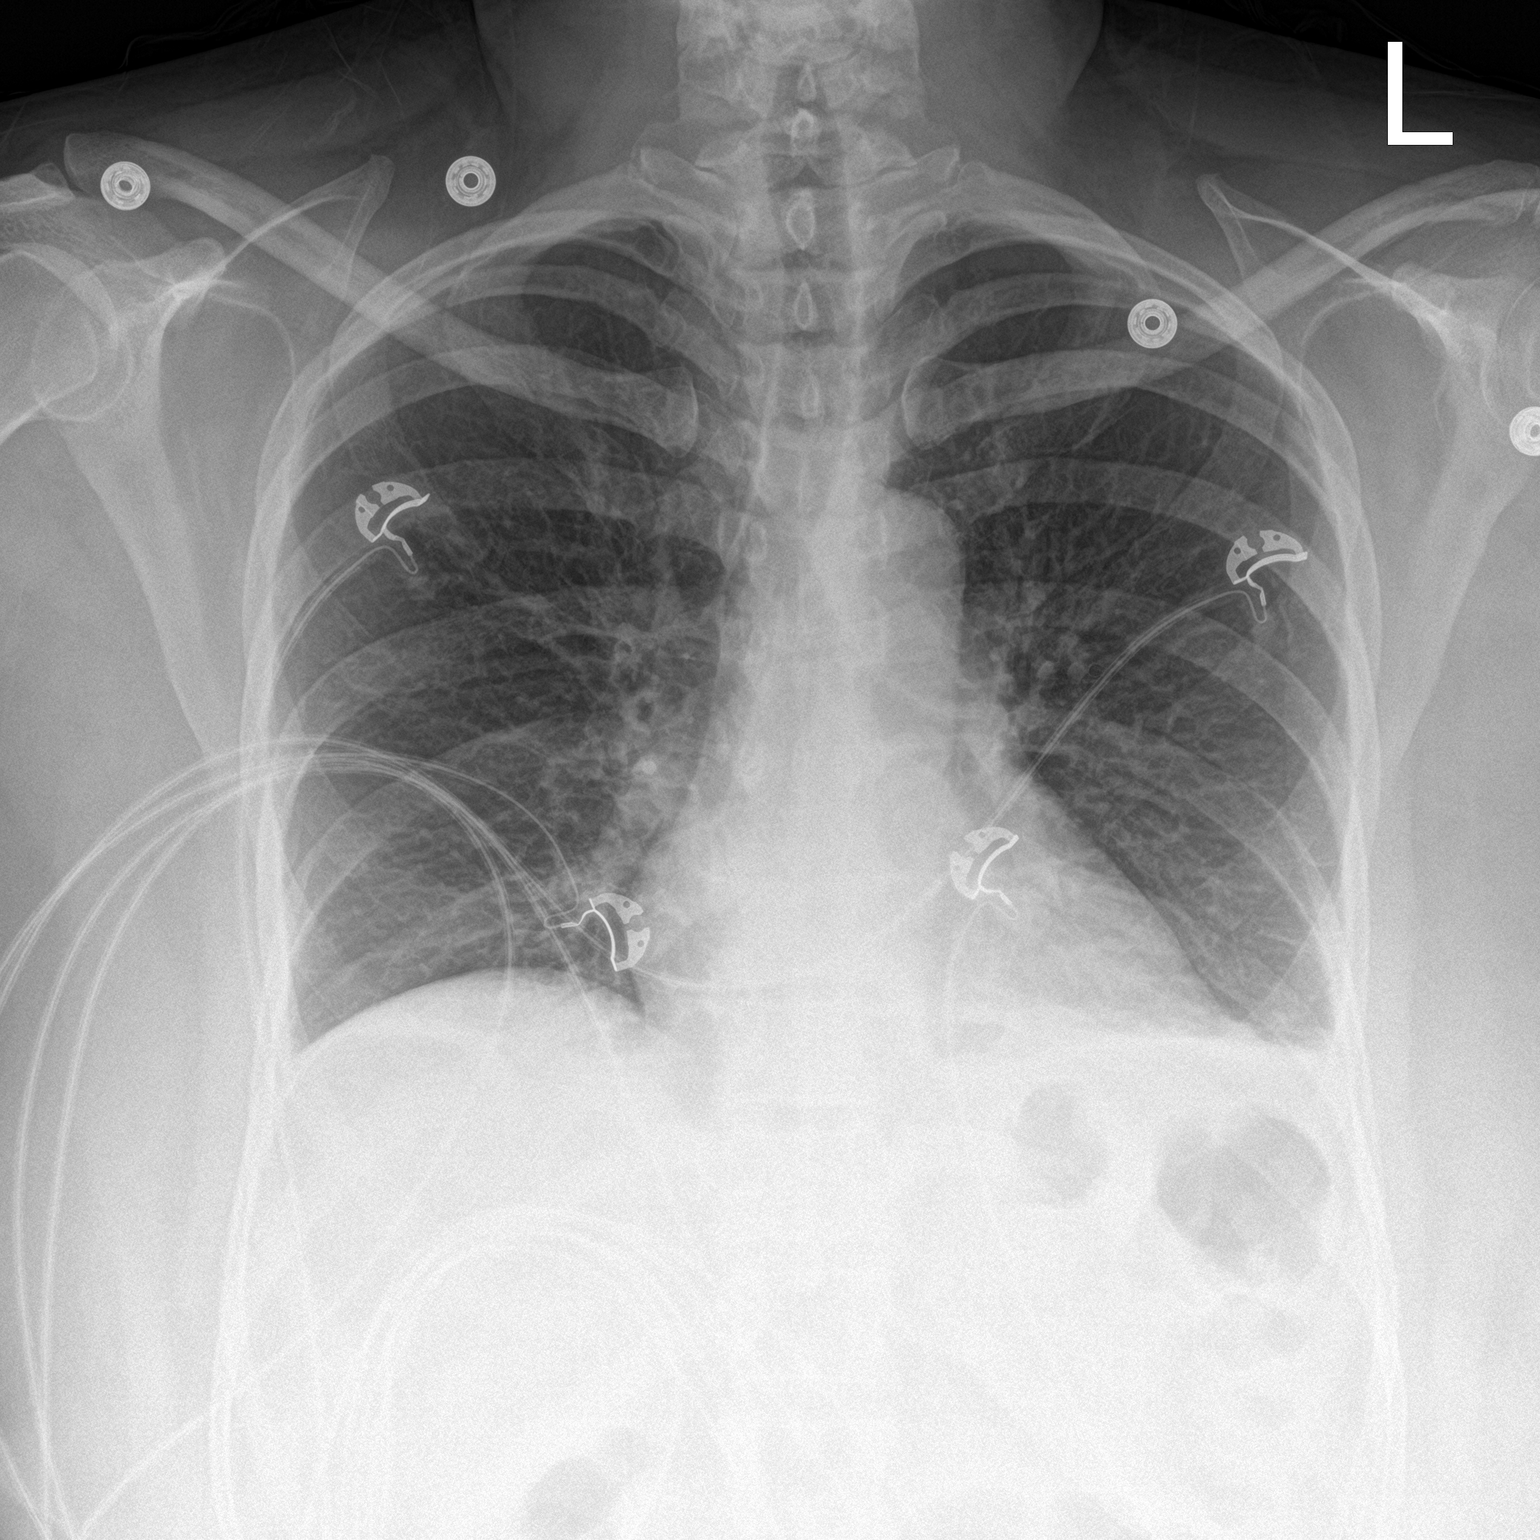

[chest lat]
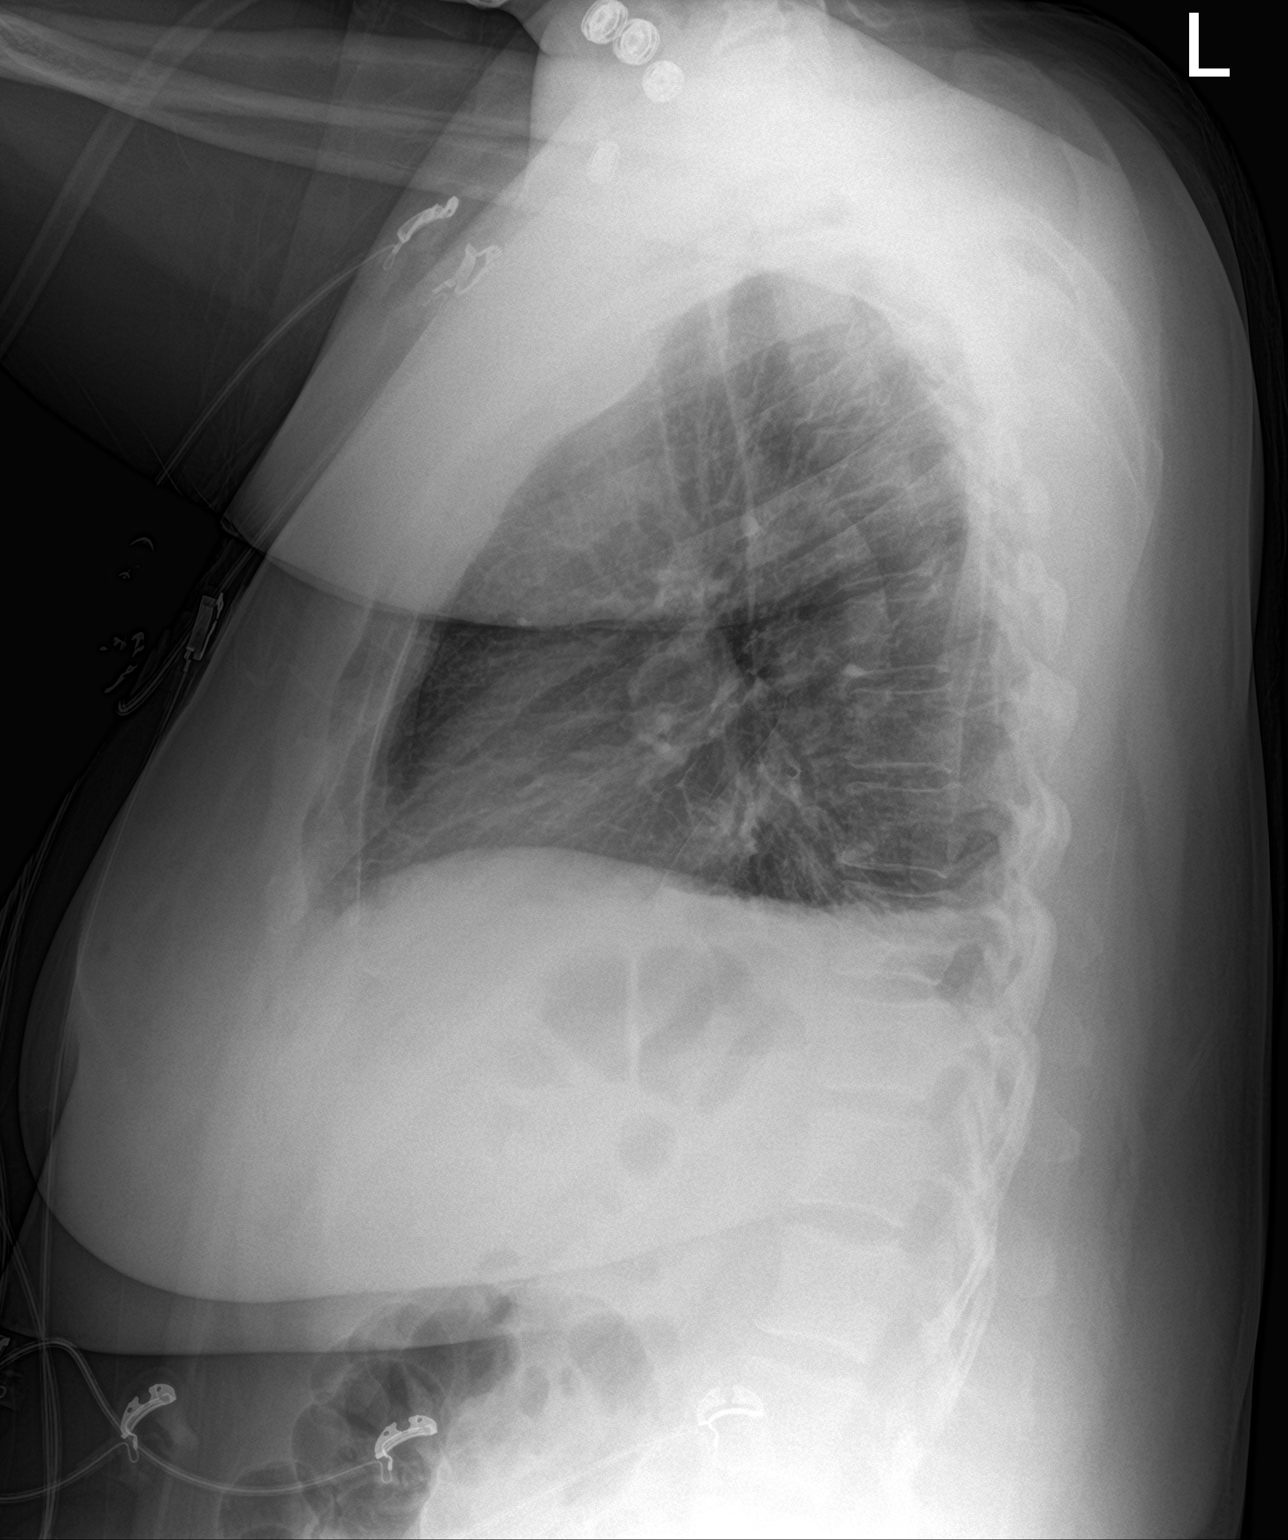

[2 of 2 positions shown; findings below may reference images not displayed]

FINDINGS: Low lung volumes with some streaky atelectatic changes in the bases.
Few septal lines are present as well as some central vascular
congestion which could indicate mild edema with a small left pleural
effusion. Cardiomediastinal contours are similar to priors
accounting for low volumes. No acute osseous or soft tissue
abnormality. Telemetry leads overlie the chest.
IMPRESSION: 1. Low lung volumes with some streaky atelectatic changes in the
bases.
2. Features suggesting mild interstitial edema with small left
pleural effusion.

## 2020-11-05 ENCOUNTER — Ambulatory Visit: Payer: BC Managed Care – PPO

## 2020-11-05 ENCOUNTER — Encounter (HOSPITAL_COMMUNITY): Payer: BC Managed Care – PPO

## 2020-12-07 ENCOUNTER — Other Ambulatory Visit: Payer: Self-pay

## 2020-12-07 ENCOUNTER — Encounter: Payer: Self-pay | Admitting: Physician Assistant

## 2020-12-07 ENCOUNTER — Ambulatory Visit: Payer: BC Managed Care – PPO | Admitting: Physician Assistant

## 2020-12-07 ENCOUNTER — Ambulatory Visit (HOSPITAL_COMMUNITY)
Admission: RE | Admit: 2020-12-07 | Discharge: 2020-12-07 | Disposition: A | Discharge status: Home / Self Care | Payer: BC Managed Care – PPO | Source: Ambulatory Visit | Attending: Surgery | Admitting: Surgery

## 2020-12-07 VITALS — BP 163/92 | HR 72 | Temp 98.0°F | Ht 64.0 in | Wt 263.0 lb

## 2020-12-07 DIAGNOSIS — I82401 Acute embolism and thrombosis of unspecified deep veins of right lower extremity: Secondary | ICD-10-CM | POA: Insufficient documentation

## 2020-12-07 DIAGNOSIS — I82431 Acute embolism and thrombosis of right popliteal vein: Secondary | ICD-10-CM

## 2020-12-07 NOTE — Progress Notes (Signed)
Office Note     CC:  follow up Requesting Provider:  Lin Landsman, MD  HPI: Ruth Bryant is a 50 y.o. (02-May-1971) female who presents for follow up of RLE DVT. 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 She was initiated on Xarelto after this was identified on duplex. She initially had presented with right calf pain, swelling and difficulty bearing weight on her right leg after sitting for prolonged period of time.   She was last seen in March of this year. At the time overall she was doing well but she was still having some discomfort in her leg if she did not wear compression. She additionally was having some hot flashes and chills which she felt was possibly associated with Xarelto. She was encouraged to continue to use her stockings, elevate, exercise, and follow up with PCP for evaluation of her hot and cold symptoms.   Today she reports she overall is doing better. She has minimal swelling or discomfort in her leg unless very prolonged sitting or standing/ ambulation. She has been wearing her compression socks most days. They still help her leg feel better at times. She does also elevated as needed. She reports that her hot and cold flashes that she was having are better now. She still remains on Xarelto  Past Medical History:  Diagnosis Date   Allergy     Past Surgical History:  Procedure Laterality Date   UTERINE FIBROID EMBOLIZATION      Social History   Socioeconomic History   Marital status: Single    Spouse name: Not on file   Number of children: 0   Years of education: Not on file   Highest education level: Not on file  Occupational History   Not on file  Tobacco Use   Smoking status: Never   Smokeless tobacco: Never  Substance and Sexual Activity   Alcohol use: Yes   Drug use: Not Currently   Sexual activity: Not on file  Other Topics Concern   Not on file  Social History Narrative   Not on file   Social  Determinants of Health   Financial Resource Strain: Not on file  Food Insecurity: Not on file  Transportation Needs: Not on file  Physical Activity: Not on file  Stress: Not on file  Social Connections: Not on file  Intimate Partner Violence: Not on file    Family History  Problem Relation Age of Onset   Breast cancer Paternal Grandmother    Hypertension Mother    Deep vein thrombosis Mother    Hypertension Father     Current Outpatient Medications  Medication Sig Dispense Refill   Homeopathic Products (ARNICA MONTANA PO) Take 2 tablets by mouth as needed (muscle pain).     OVER THE COUNTER MEDICATION Peony and licorice     XARELTO 20 MG TABS tablet Take 20 mg by mouth daily.     celecoxib (CELEBREX) 100 MG capsule Take 100 mg by mouth 2 (two) times daily. (Patient not taking: Reported on 12/07/2020)     Chaste Tree (VITEX EXTRACT PO) Take 1 capsule by mouth daily. Only take while on period. (Patient not taking: Reported on 12/07/2020)     NON FORMULARY Propheria cream  ADP - 1/4 teaspoon per day after period stops and stop the day period begins. (Patient not taking: Reported on 12/07/2020)     NONFORMULARY OR COMPOUNDED ITEM Take 1 Dose by mouth daily. Tincture, liquid  taken with dropper (Patient not taking: Reported on 12/07/2020)     Nutritional Supplements (CALCIUM D-GLUCARATE PO) Take 5 capsules by mouth daily. Do not take while on period.     OVER THE COUNTER MEDICATION Take 1 capsule by mouth 2 (two) times daily. Fibrovera Advanced Hormonal Support; only take while on period. (Patient not taking: Reported on 12/07/2020)     OVER THE COUNTER MEDICATION Take 400 mg by mouth daily. Seeking Health DIM + I3C 400mg  capsules; only take when period ends (Patient not taking: Reported on 12/07/2020)     No current facility-administered medications for this visit.    Allergies  Allergen Reactions   Dairycare [Lactase-Lactobacillus]    Shellfish Allergy Shortness Of Breath and Swelling    Other Hives, Itching and Swelling    Melons      REVIEW OF SYSTEMS:  [X]  denotes positive finding, [ ]  denotes negative finding Cardiac  Comments:  Chest pain or chest pressure:    Shortness of breath upon exertion:    Short of breath when lying flat:    Irregular heart rhythm:        Vascular    Pain in calf, thigh, or hip brought on by ambulation:    Pain in feet at night that wakes you up from your sleep:     Blood clot in your veins:    Leg swelling:         Pulmonary    Oxygen at home:    Productive cough:     Wheezing:         Neurologic    Sudden weakness in arms or legs:     Sudden numbness in arms or legs:     Sudden onset of difficulty speaking or slurred speech:    Temporary loss of vision in one eye:     Problems with dizziness:         Gastrointestinal    Blood in stool:     Vomited blood:         Genitourinary    Burning when urinating:     Blood in urine:        Psychiatric    Major depression:         Hematologic    Bleeding problems:    Problems with blood clotting too easily:        Skin    Rashes or ulcers:        Constitutional    Fever or chills:      PHYSICAL EXAMINATION:  Vitals:   12/07/20 1107  BP: (!) 163/92  Pulse: 72  Temp: 98 F (36.7 C)  TempSrc: Skin  SpO2: 98%  Weight: 263 lb (119.3 kg)  Height: 5\' 4"  (1.626 m)    General:  WDWN in NAD; vital signs documented above Gait: Normal HENT: WNL, normocephalic Pulmonary: normal non-labored breathing , without wheezing Cardiac: regular HR, without  Murmurs  Abdomen: obese Vascular Exam/Pulses:  Right Left  Radial 2+ (normal) 2+ (normal)  Femoral 2+ (normal) 2+ (normal)  Popliteal Not palpable Not palpable  DP 2+ (normal) 2+ (normal)  PT 2+ (normal) 2+ (normal)   Extremities: without ischemic changes, without Gangrene , without cellulitis; without open wounds; no appreciable lower extremity swelling bilaterally Musculoskeletal: no muscle wasting or  atrophy  Neurologic: A&O X 3;  No focal weakness or paresthesias are detected Psychiatric:  The pt has Normal affect.   Non-Invasive Vascular Imaging:   Venous duplex RLE shows small, contracted, non  compressible right popliteal vein. This is consistent with her old DVT. Likely secondary to vein wall scarring    ASSESSMENT/PLAN:: 50 y.o. female here for follow up for RLE DVT. 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 At her last visit her DVT was only visible in the popliteal vein. On Duplex today she has some residual non compressibility in her popliteal vein and the vein is small and contracted. This is likely secondary to vein wall scarring from her prior DVT. She has no new DVT. Symptoms are improved in her RLE. I have encouraged her to continue to exercise, elevate, wear her compression stockings, and also refrain from prolonged sitting or standing. - She has now been on Xarelto for >6 months. This was her only DVT she has had and caused by mechanical fall. At this time she could stop her Xarelto. She will discuss this with her PCP at her next follow up - reviewed signs and symptoms of DVT with her and she will follow up if she has any new or concerning symptoms  -She will follow up with [place] as needed   Karoline Caldwell, PA-C [facility] [phone]  Clinic MD:  Arvella Nigh

## 2023-01-31 ENCOUNTER — Ambulatory Visit (HOSPITAL_COMMUNITY)
Admission: RE | Admit: 2023-01-31 | Discharge: 2023-01-31 | Disposition: A | Discharge status: Home / Self Care | Payer: BC Managed Care – PPO | Source: Ambulatory Visit | Attending: Internal Medicine | Admitting: Internal Medicine

## 2023-01-31 ENCOUNTER — Other Ambulatory Visit (HOSPITAL_COMMUNITY): Payer: Self-pay

## 2023-01-31 ENCOUNTER — Encounter (HOSPITAL_COMMUNITY): Payer: Self-pay | Admitting: Student-PharmD

## 2023-01-31 ENCOUNTER — Other Ambulatory Visit (HOSPITAL_COMMUNITY): Payer: Self-pay | Admitting: Orthopaedic Surgery

## 2023-01-31 ENCOUNTER — Ambulatory Visit (HOSPITAL_BASED_OUTPATIENT_CLINIC_OR_DEPARTMENT_OTHER)
Admission: RE | Admit: 2023-01-31 | Discharge: 2023-01-31 | Disposition: A | Discharge status: Home / Self Care | Payer: BC Managed Care – PPO | Source: Ambulatory Visit | Attending: Vascular Surgery | Admitting: Vascular Surgery

## 2023-01-31 VITALS — BP 134/69 | HR 74

## 2023-01-31 DIAGNOSIS — I82431 Acute embolism and thrombosis of right popliteal vein: Secondary | ICD-10-CM

## 2023-01-31 DIAGNOSIS — M7989 Other specified soft tissue disorders: Secondary | ICD-10-CM | POA: Insufficient documentation

## 2023-01-31 DIAGNOSIS — M79604 Pain in right leg: Secondary | ICD-10-CM | POA: Diagnosis not present

## 2023-01-31 LAB — COMPREHENSIVE METABOLIC PANEL
ALT: 13 U/L (ref 0–44)
AST: 17 U/L (ref 15–41)
Albumin: 3.8 g/dL (ref 3.5–5.0)
Alkaline Phosphatase: 112 U/L (ref 38–126)
Anion gap: 6 (ref 5–15)
BUN: 11 mg/dL (ref 6–20)
CO2: 29 mmol/L (ref 22–32)
Calcium: 9.3 mg/dL (ref 8.9–10.3)
Chloride: 105 mmol/L (ref 98–111)
Creatinine, Ser: 1.18 mg/dL — ABNORMAL HIGH (ref 0.44–1.00)
GFR, Estimated: 56 mL/min — ABNORMAL LOW (ref >60)
Glucose, Bld: 77 mg/dL (ref 70–99)
Potassium: 4.1 mmol/L (ref 3.5–5.1)
Sodium: 140 mmol/L (ref 135–145)
Total Bilirubin: 1.2 mg/dL (ref 0.3–1.2)
Total Protein: 7.2 g/dL (ref 6.5–8.1)

## 2023-01-31 LAB — CBC
HCT: 42.4 % (ref 36.0–46.0)
Hemoglobin: 13.7 g/dL (ref 12.0–15.0)
MCH: 29.4 pg (ref 26.0–34.0)
MCHC: 32.3 g/dL (ref 30.0–36.0)
MCV: 91 fL (ref 80.0–100.0)
Platelets: 303 10*3/uL (ref 150–400)
RBC: 4.66 MIL/uL (ref 3.87–5.11)
RDW: 13.1 % (ref 11.5–15.5)
WBC: 7.3 10*3/uL (ref 4.0–10.5)
nRBC: 0 % (ref 0.0–0.2)

## 2023-01-31 MED ORDER — XARELTO VTE STARTER PACK 15 & 20 MG PO TBPK
ORAL_TABLET | ORAL | 0 refills | Status: DC
  Filled 2023-01-31: qty 51, 30d supply, fill #0

## 2023-01-31 NOTE — Progress Notes (Cosign Needed)
DVT Clinic Note  Name: Ruth Bryant     MRN: [id number]     DOB: 1970/11/05     Sex: female  PCP: Leilani Able, MD  Today's Visit: Visit Information: Initial Visit  Referred to DVT Clinic by: Dr. Everardo Pacific [facility])  Referred to CPP by: Dr. Edilia Bo Reason for referral:  Chief Complaint  Patient presents with   DVT   HISTORY OF PRESENT ILLNESS: Ruth Bryant is a 52 y.o. female who presents after diagnosis of DVT for medication management. 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 Her follow up ultrasound 11/2020 still showed noncompressible right popliteal vein consistent with having had a previous DVT. The patient had a right ACL repair 01/25/23. She was started on Xarelto 10 mg daily for DVT prophylaxis given her history of DVT, which she was adherent to. She has been up and moving since the surgery and participating in physical therapy. Three days ago she began to have right calf and knee pain, and dopplers today showed age indeterminate DVT of the right popliteal vein. When she was on Xarelto previously she had no bleeding complications other than increased menstrual bleeding, but she no longer menstruates.   Positive Thrombotic Risk Factors: Previous VTE, Recent surgery (within 3 months), Obesity Bleeding Risk Factors: None Present  Negative Thrombotic Risk Factors: Recent trauma (within 3 months), Recent admission to hospital with acute illness (within 3 months), Paralysis, paresis, or recent plaster cast immobilization of lower extremity, Central venous catheterization, Bed rest >72 hours within 3 months, Sedentary journey lasting >8 hours within 4 weeks, Pregnancy, Within 6 weeks postpartum, Recent cesarean section (within 3 months), Estrogen therapy, Testosterone therapy, Erythropoiesis-stimulating agent, Recent COVID diagnosis (within 3 months), Active cancer,  Non-malignant, chronic inflammatory condition, Known thrombophilic condition, Smoking, Older age  Rx Insurance Coverage: Commercial Rx Affordability: Xarelto is $0/month on her insurance.  Preferred Pharmacy: Starter pack filled today at [facility] during her visit. Patient prefers refills to be sent to [facility] for delivery.   Past Medical History:  Diagnosis Date   Allergy     Past Surgical History:  Procedure Laterality Date   UTERINE FIBROID EMBOLIZATION      Social History   Socioeconomic History   Marital status: Single    Spouse name: Not on file   Number of children: 0   Years of education: Not on file   Highest education level: Not on file  Occupational History   Not on file  Tobacco Use   Smoking status: Never   Smokeless tobacco: Never  Substance and Sexual Activity   Alcohol use: Yes   Drug use: Not Currently   Sexual activity: Not on file  Other Topics Concern   Not on file  Social History Narrative   Not on file   Social Determinants of Health   Financial Resource Strain: Not on file  Food Insecurity: Not on file  Transportation Needs: Not on file  Physical Activity: Not on file  Stress: Not on file  Social Connections: Unknown (11/09/2021)   Received from [facility], [facility]   Social Network    Social Network: Not on file  Intimate Partner Violence: Unknown (10/01/2021)   Received from [facility], [facility]   HITS    Physically Hurt: Not on file    Insult or Talk Down To: Not on file  Threaten Physical Harm: Not on file    Scream or Curse: Not on file    Family History  Problem Relation Age of Onset   Breast cancer Paternal Grandmother    Hypertension Mother    Deep vein thrombosis Mother    Hypertension Father     Allergies as of 01/31/2023 - Review Complete 01/31/2023  Allergen Reaction Noted   Dairycare [lactase-lactobacillus]  06/05/2020   Shellfish allergy Shortness Of Breath and Swelling 12/10/2019   Other  Hives, Itching, and Swelling 12/10/2019    Current Outpatient Medications on File Prior to Encounter  Medication Sig Dispense Refill   acetaminophen (TYLENOL) 325 MG tablet Take 650 mg by mouth every 6 (six) hours as needed for moderate pain or mild pain.     gabapentin (NEURONTIN) 100 MG capsule Take 100 mg by mouth 3 (three) times daily.     Homeopathic Products (ARNICA [place] PO) Take 2 tablets by mouth as needed (muscle pain).     L-ARGININE PO Take by mouth.     methocarbamol (ROBAXIN) 500 MG tablet Take 500 mg by mouth every 8 (eight) hours as needed.     OVER THE COUNTER MEDICATION Hypericum - 1 tincture/hour     oxyCODONE (OXY IR/ROXICODONE) 5 MG immediate release tablet Take 5-10 mg by mouth every 6 (six) hours as needed.     No current facility-administered medications on file prior to encounter.   REVIEW OF SYSTEMS:  Review of Systems  Respiratory:  Negative for shortness of breath.   Cardiovascular:  Positive for leg swelling. Negative for chest pain and palpitations.  Musculoskeletal:  Positive for myalgias.  Neurological:  Positive for tingling. Negative for dizziness.   PHYSICAL EXAMINATION:  Vitals:   01/31/23 1651  BP: 134/69  Pulse: 74  SpO2: 100%    Physical Exam Vitals reviewed.  Cardiovascular:     Rate and Rhythm: Normal rate.  Pulmonary:     Effort: Pulmonary effort is normal.  Musculoskeletal:        General: Tenderness present.     Right lower leg: Edema (mild) present.     Left lower leg: No edema.  Skin:    Findings: Bruising present. No erythema.  Psychiatric:        Mood and Affect: Mood normal.        Behavior: Behavior normal.        Thought Content: Thought content normal.   Villalta Score for Post-Thrombotic Syndrome: Pain: Mild Cramps: Mild Heaviness: Absent Paresthesia: Moderate Pruritus: Absent Pretibial Edema: Mild Skin Induration: Absent Hyperpigmentation: Absent Redness: Absent Venous Ectasia: Mild Pain on calf  compression: Mild Villalta Preliminary Score: 7 Is venous ulcer present?: No If venous ulcer is present and score is <15, then 15 points total are assigned: Absent Villalta Total Score: 7  LABS:  CBC     Component Value Date/Time   WBC 7.9 12/10/2019 1627   RBC 4.49 12/10/2019 1627   HGB 13.1 12/10/2019 1627   HCT 42.1 12/10/2019 1627   PLT 256 12/10/2019 1627   MCV 93.8 12/10/2019 1627   MCH 29.2 12/10/2019 1627   MCHC 31.1 12/10/2019 1627   RDW 14.0 12/10/2019 1627   LYMPHSABS 2.6 12/10/2019 1627   MONOABS 0.6 12/10/2019 1627   EOSABS 0.2 12/10/2019 1627   BASOSABS 0.1 12/10/2019 1627    Hepatic Function      Component Value Date/Time   PROT 6.8 12/10/2019 1745   ALBUMIN 3.6 12/10/2019 1745   AST 17 12/10/2019 1745  ALT 14 12/10/2019 1745   ALKPHOS 98 12/10/2019 1745   BILITOT 0.6 12/10/2019 1745   BILIDIR 0.2 12/10/2019 1745   IBILI 0.4 12/10/2019 1745    Renal Function   Lab Results  Component Value Date   CREATININE 1.00 12/10/2019    CrCl cannot be calculated (Patient's most recent lab result is older than the maximum 21 days allowed.).   [facility] Vascular Lab Studies:  01/31/23  VAS [place] LOWER EXTREMITY VENOUS (DVT)RIGHT Summary:  RIGHT:  - Findings consistent with age indeterminate deep vein thrombosis  involving the right popliteal vein.  - No cystic structure found in the popliteal fossa.    LEFT:  - No evidence of common femoral vein obstruction.   ASSESSMENT: Location of DVT: Right popliteal vein Cause of DVT: provoked by a transient risk factor - s/p right ACL surgery 01/25/23  Patient has a history of a provoked age indeterminate DVT in the right popliteal vein in 2021 and was followed by [facility] at that time. She was treated with Xarelto for 6 months without issue. Ultrasound today shows age indeterminate DVT in the right popliteal vein again. Discussed the patient with Dr. Edilia Bo to determine whether this is a new clot or residual clot from her last DVT  given it's only in the popliteal and is already age-indeterminate 1 week out from surgery. Since her symptoms are new, we decided to treat this as an acute DVT. Will plan to treat provoked DVT for 3 months per Dr. Edilia Bo. We will discontinue Xarelto 10 mg which was being used for DVT prophylaxis s/p the ACL surgery and start the Xarelto starter pack. This was filled today, and she asks for her refills to be mailed to her from our pharmacy since she's not currently driving which I will arrange. Last labs are from 03/2022. Checked baseline CBC and CMET today.   PLAN: -Stop Xarelto 10 mg daily. -Start rivaroxaban (Xarelto) 15 mg twice daily with food for 21 days followed by 20 mg daily with food. -Expected duration of therapy: At least 3 months. Therapy started on 01/31/23. -Patient educated on purpose, proper use and potential adverse effects of rivaroxaban (Xarelto). -Discussed importance of taking medication around the same time every day. -Advised patient of medications to avoid (NSAIDs, aspirin doses >100 mg daily). -Educated that Tylenol (acetaminophen) is the preferred analgesic to lower the risk of bleeding. -Advised patient to alert all providers of anticoagulation therapy prior to starting a new medication or having a procedure. -Emphasized importance of monitoring for signs and symptoms of bleeding (abnormal bruising, prolonged bleeding, nose bleeds, bleeding from gums, discolored urine, black tarry stools). -Educated patient to present to the ED if emergent signs and symptoms of new thrombosis occur. -Counseled patient to wear compression stockings daily, removing at night.  Follow up: 3 months in DVT Clinic  Pervis Hocking, PharmD, [place], CPP Deep Vein Thrombosis Clinic Clinical Pharmacist Practitioner Office: [phone]  I have evaluated the patient's chart/imaging and refer this patient to the Clinical Pharmacist Practitioner for medication management. I have reviewed the CPP's  documentation and agree with her assessment and plan. I was immediately available during the visit for questions and collaboration.   Waverly Ferrari, MD

## 2023-01-31 NOTE — Patient Instructions (Signed)
-  Stop Xarelto 10 mg daily. -Start rivaroxaban (Xarelto) 15 mg twice daily with food for 21 days followed by 20 mg daily with food. -Your refills have been sent to our [facility] to mail to you.  -It is important to take your medication around the same time every day.  -Avoid NSAIDs like ibuprofen (Advil, Motrin) and naproxen (Aleve) as well as aspirin doses over 100 mg daily. -Tylenol (acetaminophen) is the preferred over the counter pain medication to lower the risk of bleeding. -Be sure to alert all of your health care providers that you are taking an anticoagulant prior to starting a new medication or having a procedure. -Monitor for signs and symptoms of bleeding (abnormal bruising, prolonged bleeding, nose bleeds, bleeding from gums, discolored urine, black tarry stools). If you have fallen and hit your head OR if your bleeding is severe or not stopping, seek emergency care.  -Go to the emergency room if emergent signs and symptoms of new clot occur (new or worse swelling and pain in an arm or leg, shortness of breath, chest pain, fast or irregular heartbeats, lightheadedness, dizziness, fainting, coughing up blood) or if you experience a significant color change (pale or blue) in the extremity that has the DVT.  -We recommend you wear compression stockings (20-30 mmHg) as long as you are having swelling or pain. Be sure to purchase the correct size and take them off at night. Elevate your legs as well.   Your next visit is on Wednesday, November 6th at 1:30pm.  [facility] D[facility] [address] [place], [place], [place] [address] Enter the hospital through Entrance C off [facility] and pull up to the [facility] Vascular Center entrance to the free valet parking.  Check in for your appointment at the Heart & Vascular Center.   If you have any questions or need to reschedule an appointment, please call [phone] [facility].  If you are having an emergency, call 911 or present  to the nearest emergency room.   What is a DVT?  -Deep vein thrombosis (DVT) is a condition in which a blood clot forms in a vein of the deep venous system which can occur in the lower leg, thigh, pelvis, arm, or neck. This condition is serious and can be life-threatening if the clot travels to the arteries of the lungs and causing a blockage (pulmonary embolism, PE). A DVT can also damage veins in the leg, which can lead to long-term venous disease, leg pain, swelling, discoloration, and ulcers or sores (post-thrombotic syndrome).  -Treatment may include taking an anticoagulant medication to prevent more clots from forming and the current clot from growing, wearing compression stockings, and/or surgical procedures to remove or dissolve the clot.

## 2023-02-22 ENCOUNTER — Other Ambulatory Visit (HOSPITAL_COMMUNITY): Payer: Self-pay

## 2023-02-22 ENCOUNTER — Other Ambulatory Visit (HOSPITAL_COMMUNITY): Payer: Self-pay | Admitting: Student-PharmD

## 2023-02-22 DIAGNOSIS — I82431 Acute embolism and thrombosis of right popliteal vein: Secondary | ICD-10-CM

## 2023-02-22 MED ORDER — RIVAROXABAN 20 MG PO TABS
20.0000 mg | ORAL_TABLET | Freq: Every day | ORAL | 0 refills | Status: DC
Start: 2023-02-22 — End: 2023-05-03
  Filled 2023-02-22: qty 60, 60d supply, fill #0

## 2023-05-03 ENCOUNTER — Ambulatory Visit (HOSPITAL_COMMUNITY): Payer: BC Managed Care – PPO

## 2023-05-03 ENCOUNTER — Ambulatory Visit (HOSPITAL_COMMUNITY)
Admission: RE | Admit: 2023-05-03 | Discharge: 2023-05-03 | Disposition: A | Discharge status: Home / Self Care | Payer: BC Managed Care – PPO | Source: Ambulatory Visit | Attending: Surgery | Admitting: Surgery

## 2023-05-03 ENCOUNTER — Encounter (HOSPITAL_COMMUNITY): Payer: Self-pay

## 2023-05-03 VITALS — BP 128/63 | HR 64

## 2023-05-03 DIAGNOSIS — I82431 Acute embolism and thrombosis of right popliteal vein: Secondary | ICD-10-CM | POA: Insufficient documentation

## 2023-05-03 NOTE — Patient Instructions (Signed)
You have been discharged from the DVT Clinic! No further follow up in the DVT Clinic is needed.  -Finish your current supply of Xarelto then discontinue as you will have completed 3 months of treatment.   Please reach out if any questions come up. [phone] [facility].

## 2023-05-03 NOTE — Progress Notes (Addendum)
DVT Clinic Note  Name: Ruth HERSHKOWITZ     MRN: [id number]     DOB: 1970-09-13     Sex: female  PCP: Leilani Able, MD  Today's Visit: Visit Information: Discharge Visit  Referred to DVT Clinic by: Orthopedic Surgery - Dr. Everardo [facility]) Referred to CPP by: Dr. Myra Gianotti Reason for referral:  Chief Complaint  Patient presents with   Med Management - DVT   HISTORY OF PRESENT ILLNESS: Ruth Bryant is a 52 y.o. female who presents after diagnosis of DVT for medication management. 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 Her follow up ultrasound 11/2020 still showed noncompressible right popliteal vein consistent with having had a previous DVT. The patient had a right ACL repair 01/25/23. She was started on Xarelto 10 mg daily for DVT prophylaxis given her history of DVT, which she was adherent to. After surgery, she was up moving and participating in physical therapy. Around 01/28/23 she began to have right calf and knee pain, and dopplers on 01/31/23 showed age indeterminate DVT of the right popliteal vein. When she was on Xarelto previously she had no bleeding complications other than increased menstrual bleeding, but she no longer menstruates. She was initially seen in DVT clinic on 01/31/23 at which time Xarelto 10 mg daily was stopped and she was started on treatment dose Xarelto with the plan to continue for at least 3 months.  Today patient reports she is doing well. Denies abnormal bleeding or bruising. Denies missed doses of Xarelto. Not wearing compression stockings, but swelling has resolved. She has tolerated treatment with Xarelto well. Reports she has a few tablets left. Denies SOB, chest pain, dizziness, lightheadedness, palpitations, RLE swelling and tenderness.  Positive Thrombotic Risk Factors: Previous VTE, Recent surgery (within 3 months), Obesity Bleeding Risk  Factors: Anticoagulant therapy  Negative Thrombotic Risk Factors: Non-malignant, chronic inflammatory condition, Known thrombophilic condition, Smoking, Recent COVID diagnosis (within 3 months), Active cancer, Erythropoiesis-stimulating agent, Within 6 weeks postpartum, Recent cesarean section (within 3 months), Estrogen therapy, Testosterone therapy, Pregnancy, Sedentary journey lasting >8 hours within 4 weeks, Bed rest >72 hours within 3 months, Paralysis, paresis, or recent plaster cast immobilization of lower extremity, Central venous catheterization, Recent admission to hospital with acute illness (within 3 months), Recent trauma (within 3 months)  Rx Insurance Coverage: Commercial Rx Affordability:  Xarelto is $0/month on her insurance.  Preferred Pharmacy: [facility] for delivery  Past Medical History:  Diagnosis Date   Allergy     Past Surgical History:  Procedure Laterality Date   UTERINE FIBROID EMBOLIZATION      Social History   Socioeconomic History   Marital status: Single    Spouse name: Not on file   Number of children: 0   Years of education: Not on file   Highest education level: Not on file  Occupational History   Not on file  Tobacco Use   Smoking status: Never   Smokeless tobacco: Never  Substance and Sexual Activity   Alcohol use: Yes   Drug use: Not Currently   Sexual activity: Not on file  Other Topics Concern   Not on file  Social History Narrative   Not on file   Social Determinants of Health   Financial Resource Strain: Not on file  Food Insecurity: Not on file  Transportation Needs: Not on file  Physical Activity: Not on file  Stress: Not on file  Social Connections: Unknown (11/09/2021)   [facility] from [facility], [facility]   Social Network    Social Network: Not on file  Intimate Partner Violence: Unknown (10/01/2021)   [facility], [facility]   HITS    Physically Hurt: Not on file    Insult or Talk Down To: Not on  file    Threaten Physical Harm: Not on file    Scream or Curse: Not on file    Family History  Problem Relation Age of Onset   Breast cancer Paternal Grandmother    Hypertension Mother    Deep vein thrombosis Mother    Hypertension Father     Allergies as of 05/03/2023 - Review Complete 05/03/2023  Allergen Reaction Noted   Dairycare [lactase-lactobacillus]  06/05/2020   Shellfish allergy Shortness Of Breath and Swelling 12/10/2019   Other Hives, Itching, and Swelling 12/10/2019    Current Outpatient Medications on File Prior to Encounter  Medication Sig Dispense Refill   Homeopathic Products (ARNICA [place] PO) Take 2 tablets by mouth as needed (muscle pain).     L-ARGININE PO Take by mouth.     OVER THE COUNTER MEDICATION Hypericum - 1 tincture/hour     No current facility-administered medications on file prior to encounter.   REVIEW OF SYSTEMS:  Review of Systems  Respiratory:  Negative for shortness of breath.   Cardiovascular:  Negative for chest pain, palpitations and leg swelling.  Musculoskeletal:  Negative for myalgias.  Neurological:  Negative for dizziness and tingling.   PHYSICAL EXAMINATION:  Vitals:   05/03/23 1111  BP: 128/63  Pulse: 64  SpO2: 100%    Villalta Score for Post-Thrombotic Syndrome: Pain: Absent Cramps: Absent Heaviness: Absent Paresthesia: Absent Pruritus: Absent Pretibial Edema: Absent Skin Induration: Absent Hyperpigmentation: Absent Redness: Absent Venous Ectasia: Absent Pain on calf compression: Absent Villalta Preliminary Score: 0 Is venous ulcer present?: No If venous ulcer is present and score is <15, then 15 points total are assigned: Absent Villalta Total Score: 0  LABS:  CBC     Component Value Date/Time   WBC 7.3 01/31/2023 1617   RBC 4.66 01/31/2023 1617   HGB 13.7 01/31/2023 1617   HCT 42.4 01/31/2023 1617   PLT 303 01/31/2023 1617   MCV 91.0 01/31/2023 1617   MCH 29.4 01/31/2023 1617   MCHC 32.3 01/31/2023  1617   RDW 13.1 01/31/2023 1617   LYMPHSABS 2.6 12/10/2019 1627   MONOABS 0.6 12/10/2019 1627   EOSABS 0.2 12/10/2019 1627   BASOSABS 0.1 12/10/2019 1627    Hepatic Function      Component Value Date/Time   PROT 7.2 01/31/2023 1617   ALBUMIN 3.8 01/31/2023 1617   AST 17 01/31/2023 1617   ALT 13 01/31/2023 1617   ALKPHOS 112 01/31/2023 1617   BILITOT 1.2 01/31/2023 1617   BILIDIR 0.2 12/10/2019 1745   IBILI 0.4 12/10/2019 1745    Renal Function   Lab Results  Component Value Date   CREATININE 1.18 (H) 01/31/2023   CREATININE 1.00 12/10/2019    CrCl cannot be calculated (Patient's most recent lab result is older than the maximum 21 days allowed.).   [facility] Vascular Lab Studies:  01/31/23  VAS [place] LOWER EXTREMITY VENOUS (DVT)RIGHT Summary:  RIGHT:  - Findings consistent with age indeterminate deep vein thrombosis  involving the right popliteal vein.  - No cystic structure found in the popliteal fossa.    LEFT:  - No evidence of common  femoral vein obstruction.   ASSESSMENT: Location of DVT: Right popliteal vein Cause of DVT: provoked by a transient risk factor- s/p right ACL surgery 01/25/23. Patient has a history of a provoked age indeterminate DVT in the right popliteal vein in 2021 and was followed by [facility] at that time. She was treated with Xarelto for 6 months without issue. Ultrasound on 01/31/23 showed age indeterminate DVT in the right popliteal vein again. Discussed the patient with Dr. Edilia Bo to determine whether this is a new clot or residual clot from her last DVT given it's only in the popliteal vein and is already age-indeterminate 1 week out from surgery. Since her symptoms were new, we decided to treat this as an acute provoked DVT with the plan for 3 months of anticoagulation. She has tolerated Xarelto well with complete resolution of DVT symptoms. Will have her finish her last few tablets then discontinue Xarelto as she has now completed 3 months of treatment. No follow  up imaging needed.  PLAN: -Patient is discharged from the DVT Clinic. -Discontinue anticoagulation with Xarelto (after finishing current supply on hand), as patient has completed 3 months of treatment for provoked DVT.  -Counseled patient on future VTE risk reduction strategies and to inform all future providers of DVT history.  Follow up: No further follow up in DVT clinic  Jarrett Ables, PharmD PGY-1 Pharmacy Resident  Pervis Hocking, PharmD, BCACP, CPP Deep Vein Thrombosis Clinic Clinical Pharmacist Practitioner Office: [phone]   I have evaluated the patient's chart/imaging and refer this patient to the Clinical Pharmacist Practitioner for medication management. I have reviewed the CPP's documentation and agree with her assessment and plan. I was immediately available during the visit for questions and collaboration.   Durene Cal, MD

## 2023-05-23 NOTE — Progress Notes (Signed)
 [facility] CONSULTATION RETURN VIDEO VISIT  Assessment/Plan:    Problem List Items Addressed This Visit   None Visit Diagnoses       Fibroids    -  Primary         Impression: Ruth Bryant is a 52 y.o.female G0P0000 who is seen in follow up after consultation at the request of Diedre Browning, MD for an evaluation of Fibroids with bulk symptoms refractory to COLOMBIA,  desiring uterine sparing treatment.   Discussed that myomectomy is the only option for uterine sparing at this time.  Discussed risks including bleeding and emergent hysterectomy in s/o fibroid burden.  Low likihood of fibroid recurrence in s/o peri or true menopause at this point.  Discussed hysterectomy is the lower risk option and that fertility is very unlikely at this point even with REI.  She understands that given the large size of her fibroids, surgery would need to be through an open abdominal incision.  She understands that significant blood loss could occur requiring transfusion.  She was prior consented for abdominal myomectomy.  She asks about options for removing only enough fibroids to reduce the size of uterus, but avoid hysterectomy risk/bleeding.  I explained I could make this the goal, however risk of bleeding/need for emergent hysterectomy is not fully predictable and this remains a risk regardless of number of fibroids removed.   She expressed understanding.   She will consider options and reach back out if she has a decision about moving forward.  - Pap uptodate and normal - [Consider 2 weeks lovenox post op for h/o provoked DVT if has myomectomy or hysterectomy in future] - EMB at time of surgery if myomectomy chosen   The patient reports they are physically located in [place]  and is currently: at home. I conducted a audio/video visit. I spent  20m 30s on the video call with the patient. I spent an additional 15 minutes on pre- and post-visit activities on the date of service .       Subjective:  CC: Fibroids HPI:   Ruth Bryant is a 52 y.o. female G0P0000 who is seen in follow up at the request of Self, Referred for an evaluation of enlarged multifibroid uterus.  Last seen early in 2024 after hysteroscopy for sampling (benign). Plan had been for repeat COLOMBIA, last done in 2005.  Pt desired retaining uterus.  She saw VIR but ultimately decided to go do the COLOMBIA bc wasn't confident it would make much difference to her fibroid size.  Main complaints are urinary frequency and some constipation.  Prior FSH 52.  At last visit not menopausal.  Today notes has had one period within the last 14 months. The last one was one month ago.  Combination of dark blood and light blood. Lasted 5-7 days.   History at first visit: She had a COLOMBIA in 2005 for large fibroids. Had a short time of improvement with shrinkage of fibroids, but had regrowth within a year. She notes she has very irregular bleeding. Used to have horrible heavy periods. Now having very light bleeding, maybe 1 day in the last few months. Has not had a full year without period yet. Currently notes pressure, heaviness.  Finds has to pee constantly. 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  She notes she desires uterine sparing myomectomy for fibroids. She understands that she would need to use donor eggs for any future pregnancy and that her personal risk and risk of fetus is  significantly elevated in pregnancy given her age. She understands that this is very low likelihood for her, however she is not ready to close that door and would like her uterus to be retained.  FSH 52 ([facility]) H/o DVT in leg after a fall  Surgical history is notable for  COLOMBIA  Prior treatments:  COLOMBIA  Gastrointestinal: increasing constipation in last 1-2 years. She went to herbalist but didn't help much. Urinary: Frequency Dyspareunia: Sometimes painful Musculoskeletal: No  back pain, pain with sitting for along time Psych: none  The patient reports feeling safe in her relationships and at home. Lives with: Alone Work:  [employer] account executive Needs: No -mom will support   METS> 4, mild SOB , no CP with increased activities  08/25/22   Pre-operative Diagnosis:  1) Uterine fibroids with intracavitary mass   Post-operative Diagnosis: Same   Procedure:   1) Diagnostic Hysteroscopy 2) Endometrial sampling 3) Hysteroscopic myomectomy   Anesthesia: MAC   Surgeon: Tinnie Cash, MD   Assisting Surgeon:  1) R. Tillman Nageotte, [facility] felllow 2) Georgianne Pellet, PA   Findings:  Tortuous cervix and uterine cavity.  There were multiple intracavitary masses, including a 4cm fundal mass that appeared to be a fibroid and a intracervical mass that appeared to be a polyp.  The endometrium was diffusely polypoid with some areas of apparent cavitation. Bilateral tubal ostia were not visualized. Normal appearing external genitalia and cervix.        Previous Testing/Records: Outside medical records were reviewed to synthesize the above history, along with the history and physical obtained during the visit.  Outside laboratory, pathology, and imaging reports were reviewed, with pertinent results below.  IMAGING:    No results found for this or any previous visit.  2019 MRI FINDINGS:  Urinary Tract: Normal bladder. There is a simple 4 x 3 x 6 mm cystic  lesion in 4 o'clock lower periurethral region (series 5/image 20),  most compatible with a tiny Skene's gland cyst.   Bowel: Visualized small and large bowel are normal caliber with no  bowel wall thickening.   Vascular/Lymphatic: No pathologically enlarged lymph nodes in the  pelvis. No acute vascular abnormality.   Reproductive:   Uterus: The bulky anteverted myomatous uterus measures 14.0 x 8.7 x  15.9 cm (volume = 1000 cm^3). There are numerous (greater than 10)  uterine  fibroids, with representative fibroids as follows:   -posterior uterine body 2.9 x 2.9 x 3.6 cm (volume = 16 cm^3)  enhancing fibroid with approximately 30-40% submucosal component   -right anterior lower uterine body intramural 8.6 x 6.1 x 8.4 cm  (volume = 230 cm^3) heterogeneously enhancing fibroid   -left fundal intramural 8.0 x 7.5 x 8.3 cm (volume = 260 cm^3)  nonenhancing fibroid with peripheral low signal intensity compatible  with calcification   -right fundal intramural 6.9 x 5.0 x 5.1 cm (volume = 92 cm^3)  heterogeneously enhancing fibroid   No evidence of intracavitary or pedunculated fibroids. Inner  myometrium (junctional zone) measures 8 mm in thickness, which is  within normal limits. Distorted endometrium measures 3 mm in bilayer  thickness, which is within normal limits. No endometrial mass.  Cervix is unremarkable.   Ovaries and Adnexa: The right ovary measures 2.2 x 1.2 x 1.4 cm and  is normal. The left ovary measures 3.4 x 2.6 x 2.9 cm and  demonstrates multiple small follicles. There are no suspicious  ovarian or  adnexal masses.   Other: No abnormal free fluid in the pelvis. No focal pelvic fluid  collection.   Musculoskeletal: No aggressive appearing focal osseous lesions.   IMPRESSION:  1. Bulky myomatous uterus with viable and nonenhancing fibroids, as  detailed. No evidence of intracavitary or pedunculated fibroids.  2. Tiny Skene's gland cyst in the 4 o'clock lower periurethral  region.    LAST PAP: 12/2019 WNL HPV neg    EMB: NA  Lab Results  Component Value Date   WBC 6.8 04/05/2022   HGB 14.6 04/05/2022   HCT 44.5 (H) 04/05/2022   PLT 256 04/05/2022      OB History     Gravida  0   Para  0   Term  0   Preterm  0   AB  0   Living  0      SAB  0   IAB  0   Ectopic  0   Molar  0   Multiple  0   Live Births  0       Obstetric Comments  Menarche at 40 Last pap: 01/2022 negative Hx abnormal pap none  BCM:  condoms           History: Past Medical History:  Diagnosis Date  . Deep vein thrombosis (CMS-HCC)   . Fibroid     Past Surgical History:  Procedure Laterality Date  . PR HYSTEROSCOPY,W/ENDO BX N/A 08/25/2022   Procedure: HYSTEROSCOPY, SURGICAL; WITH SAMPLING (BIOPSY) OF ENDOMETRIUM &/OR POLYPECTOMY, W/WO D&C;  Surgeon: Julia Tinnie Buoy, MD;  [facility];  Service: Advanced Laparoscopy  . UTERINE ARTERY EMBOLIZATION     2005     Current Outpatient Medications on File Prior to Visit  Medication Sig Dispense Refill  . acetaminophen  (TYLENOL ) 325 MG tablet Take 2 tablets (650 mg total) by mouth every six (6) hours. (Patient not taking: Reported on 09/22/2022) 60 tablet 0  . albuterol HFA 90 mcg/actuation inhaler Inhale 2 puffs four (4) times a day.    SABRA doxycycline (VIBRAMYCIN) 100 MG capsule Take 1 capsule (100 mg total) by mouth two (2) times a day. (Patient not taking: Reported on 09/22/2022) 20 capsule 0  . ibuprofen  (MOTRIN ) 600 MG tablet Take 1 tablet (600 mg total) by mouth every six (6) hours as needed for pain. (Patient not taking: Reported on 09/22/2022) 30 tablet 0  . magnesium citrate solution Take 296 mL by mouth once.    . multivitamin capsule Take 1 capsule by mouth daily.     No current facility-administered medications on file prior to visit.   Allergies  Allergen Reactions  . Lactobacillus Acidoph-Lactase   . Shellfish Containing Products Shortness Of Breath and Swelling   No family history on file.  Social History   Socioeconomic History  . Marital status: Single  Tobacco Use  . Smoking status: Never  . Smokeless tobacco: Never  Vaping Use  . Vaping status: Never Used  Substance and Sexual Activity  . Alcohol use: Yes    Comment: rarely  . Drug use: Never  . Sexual activity: Yes    Birth control/protection: Condom   Social Drivers of Health    Received from [employer], [facility]   Social Network     The following  portions of the patient's history were reviewed and updated as appropriate: allergies, current medications, past family history, past medical history, past social history, past surgical history, and problem list.   Objective:  Exam at last vsiit:  Constitutional: Well-developed, well-nourished female in no acute distress Neurological: Alert and oriented to person, place, and time Psychiatric: Mood and affect appropriate Skin: No rashes or lesions Gastrointestinal: Soft, nontender, nondistended. No masses or hernias appreciated  hepatosplenomegaly. No fluid wave. No rebound or guarding. Genitourinary:       External Genitalia: Normal female genitalia.   Urethral Meatus: Normal caliber and position   Urethra: Midline, no masses nontender   Bladder: Well-suspended, nontender  Vagina:  Normally rugated, no lesions.   Cervix: Pushed posterior left vagina by large anterior bulk. No lesions, normal size and consistency; no cervical motion tenderness   Uterus: enlarged to 18 weeksize, mobile, non tender.   Adnexa/Parametria: No masses; no parametrial nodularity    Chaperone present: Mucario LPN

## 2024-03-15 ENCOUNTER — Ambulatory Visit (HOSPITAL_COMMUNITY)
Admission: RE | Admit: 2024-03-15 | Discharge: 2024-03-15 | Disposition: A | Discharge status: Home / Self Care | Source: Ambulatory Visit | Attending: Vascular Surgery | Admitting: Vascular Surgery

## 2024-03-15 ENCOUNTER — Other Ambulatory Visit (HOSPITAL_COMMUNITY): Payer: Self-pay | Admitting: Physician Assistant

## 2024-03-15 DIAGNOSIS — M79604 Pain in right leg: Secondary | ICD-10-CM

## 2024-03-27 NOTE — Progress Notes (Unsigned)
 DVT Clinic Note  Name: Ruth Bryant     MRN: [id number]     DOB: 1970/11/05     Sex: female  PCP: Ilah Crigler, MD  Today's Visit: Visit Information: Follow Up Visit  Referred to DVT Clinic by: Orthopedic Surgery - Aleck Stalling, PA-C Alphonse Millman)  Referred to CPP by: Dr. Lanis Reason for referral:  Chief Complaint  Patient presents with   Chronic DVT   HISTORY OF PRESENT ILLNESS: Ruth Bryant is a 53 y.o. female with PMH DVT who presents for follow up DVT management. She has a history of right femoral to posterior tibial vein DVT 05/2020 which was provoked by a mechanical fall and treated with 6 months of Xarelto . Her follow up ultrasound 11/2020 still showed noncompressible right popliteal vein consistent with having had a previous DVT. The patient had a right ACL repair 01/25/23 and was found to have age-indeterminate DVT in the right popliteal vein on 01/31/23. There was question at that time whether this was truly a new DVT or residual from her prior event. Since she had experienced new symptoms after a provoking event we decided to conservatively treat her with anticoagulation for 3 months. Last seen in DVT Clinic 05/03/23 at which time she had completed treatment. 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 Normally does not have stretches as long as that that she is standing. She was woken up that night with a sharp pain in her right calf, which was still present the next day. Denies any accompanying swelling. After that she experienced pain in her knee around her old surgery site. Pain has now resolved. Still has compression stockings that she can use as needed. Reports that ortho had prescribed a new Xarelto  starter pack after this last ultrasound but she has  not picked this up as she wanted to have her visit here first to determine if she truly needed it.   Positive Thrombotic Risk Factors: Previous VTE, Obesity Bleeding Risk Factors: None Present  Negative Thrombotic Risk Factors: Recent surgery (within 3 months), Recent trauma (within 3 months), Recent admission to hospital with acute illness (within 3 months), Paralysis, paresis, or recent plaster cast immobilization of lower extremity, Central venous catheterization, Bed rest >72 hours within 3 months, Sedentary journey lasting >8 hours within 4 weeks, Pregnancy, Within 6 weeks postpartum, Recent cesarean section (within 3 months), Estrogen therapy, Testosterone therapy, Erythropoiesis-stimulating agent, Recent COVID diagnosis (within 3 months), Active cancer, Non-malignant, chronic inflammatory condition, Known thrombophilic condition, Smoking, Older age  Rx Insurance Coverage: Commercial  Past Medical History:  Diagnosis Date   Allergy     Past Surgical History:  Procedure Laterality Date   UTERINE FIBROID EMBOLIZATION      Social History   Socioeconomic History   Marital status: Single    Spouse name: Not on file   Number of children: 0   Years of education: Not on file   Highest education level: Not on file  Occupational History   Not on file  Tobacco Use   Smoking status: Never   Smokeless tobacco: Never  Substance and Sexual Activity   Alcohol use: Yes   Drug use: Not Currently   Sexual activity: Not on file  Other Topics Concern   Not on  file  Social History Narrative   Not on file   Social Drivers of Health   Financial Resource Strain: Not on file  Food Insecurity: Not on file  Transportation Needs: Not on file  Physical Activity: Not on file  Stress: Not on file  Social Connections: Unknown (11/09/2021)   Received from [facility]   Social Network    Social Network: Not on file  Intimate Partner Violence: Unknown (10/01/2021)   Received from [facility]    HITS    Physically Hurt: Not on file    Insult or Talk Down To: Not on file    Threaten Physical Harm: Not on file    Scream or Curse: Not on file    Family History  Problem Relation Age of Onset   Breast cancer Paternal Grandmother    Hypertension Mother    Deep vein thrombosis Mother    Hypertension Father     Allergies as of 03/28/2024 - Review Complete 03/28/2024  Allergen Reaction Noted   Dairycare [bacid]  06/05/2020   Shellfish allergy Shortness Of Breath and Swelling 12/10/2019   Other Hives, Itching, and Swelling 12/10/2019    Current Outpatient Medications on File Prior to Visit  Medication Sig Dispense Refill   Homeopathic Products (ARNICA [place]  PO) Take 2 tablets by mouth as needed (muscle pain).     L-ARGININE PO Take by mouth.     OVER THE COUNTER MEDICATION Hypericum - 1 tincture/hour     No current facility-administered medications on file prior to visit.   REVIEW OF SYSTEMS:  Review of Systems  Respiratory:  Negative for shortness of breath.   Cardiovascular:  Negative for chest pain, palpitations and leg swelling.  Musculoskeletal:  Negative for myalgias.  Neurological:  Negative for dizziness and tingling.   PHYSICAL EXAMINATION:  Physical Exam Musculoskeletal:        General: No swelling or tenderness.  Skin:    Findings: No bruising or erythema.  Psychiatric:        Mood and Affect: Mood normal.        Behavior: Behavior normal.        Thought Content: Thought content normal.   LABS:  CBC     Component Value Date/Time   WBC 7.3 01/31/2023 1617   RBC 4.66 01/31/2023 1617   HGB 13.7 01/31/2023 1617   HCT 42.4 01/31/2023 1617   PLT 303 01/31/2023 1617   MCV 91.0 01/31/2023 1617   MCH 29.4 01/31/2023 1617   MCHC 32.3 01/31/2023 1617   RDW 13.1 01/31/2023 1617   LYMPHSABS 2.6 12/10/2019 1627   MONOABS 0.6 12/10/2019 1627   EOSABS 0.2 12/10/2019 1627   BASOSABS 0.1 12/10/2019 1627    Hepatic Function      Component Value Date/Time    PROT 7.2 01/31/2023 1617   ALBUMIN 3.8 01/31/2023 1617   AST 17 01/31/2023 1617   ALT 13 01/31/2023 1617   ALKPHOS 112 01/31/2023 1617   BILITOT 1.2 01/31/2023 1617   BILIDIR 0.2 12/10/2019 1745   IBILI 0.4 12/10/2019 1745    Renal Function   Lab Results  Component Value Date   CREATININE 1.18 (H) 01/31/2023   CREATININE 1.00 12/10/2019    CrCl cannot be calculated (Patient's most recent lab result is older than the maximum 21 days allowed.).   [facility] Vascular Lab Studies:  01/31/23  VAS US  LOWER EXTREMITY VENOUS (DVT) RIGHT Summary:  RIGHT:  - Findings consistent with age indeterminate deep vein thrombosis  involving the  right popliteal vein.  - No cystic structure found in the popliteal fossa.    LEFT:  - No evidence of common femoral vein obstruction.   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  - There is no evidence of acute deep vein thrombosis in the lower  extremity.  - No cystic structure found in the popliteal fossa.   LEFT:  - No evidence of common femoral vein obstruction.   ASSESSMENT: Patient with history of a provoked right femoropopliteal DVT in 05/2020 and treated appropriately with short-term anticoagulation. Follow up imaging showed residual chronic clot in the right popliteal vein. There was concern for new DVT 01/2023 after a right ACL surgery with new pain and swelling versus whether imaging was showing the same chronic clot in the right popliteal. We ultimately decided to conservatively treat for 3 months with anticoagulation. 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 Symptoms have since resolved. No indication for anticoagulation at this time. Discussed postthrombotic syndrome and the  need to continue wearing compression stockings. All the patient's questions have been answered.   PLAN: -No indication for anticoagulation at this time. Continue to use compression stockings as needed.  -Counseled patient on future VTE risk reduction strategies and to inform all future providers of DVT history.  Follow up: No further DVT Clinic follow up needed.  Lum Herald, PharmD, [place], CPP Deep Vein Thrombosis Clinic Clinical Pharmacist Practitioner [phone] I have evaluated the patient's chart/imaging and refer this patient to the Clinical Pharmacist Practitioner for medication management. I have reviewed the CPP's documentation and agree with her assessment and plan. I was immediately available during the visit for questions and collaboration.   Fonda FORBES Rim, MD

## 2024-03-28 ENCOUNTER — Encounter: Payer: Self-pay | Admitting: Student-PharmD

## 2024-03-28 ENCOUNTER — Ambulatory Visit: Attending: Vascular Surgery | Admitting: Student-PharmD

## 2024-03-28 DIAGNOSIS — I82531 Chronic embolism and thrombosis of right popliteal vein: Secondary | ICD-10-CM

## 2024-03-28 NOTE — Patient Instructions (Addendum)
 What was seen on your ultrasound is chronic DVT - this is like scarring left over from your former DVT and not a new clot. You do not need anticoagulation (blood thinners). We recommend you wear compression stockings (20-30 mmHg) as needed (would be good to wear . Be sure to purchase the correct size and take them off at night.   If you have any questions or need to reschedule an appointment, please call [phone]. If you are having an emergency, call 911 or present to the nearest emergency room.
